# Patient Record
Sex: Male | Born: 1985 | Race: White | Hispanic: No | Smoking: Current every day smoker
Health system: Southern US, Community
[De-identification: ages and names within clinical notes are randomized; demographics above are authoritative.]

## PROBLEM LIST (undated history)

## (undated) DIAGNOSIS — G43909 Migraine, unspecified, not intractable, without status migrainosus: Secondary | ICD-10-CM

## (undated) DIAGNOSIS — F329 Major depressive disorder, single episode, unspecified: Secondary | ICD-10-CM

## (undated) DIAGNOSIS — R519 Headache, unspecified: Secondary | ICD-10-CM

## (undated) DIAGNOSIS — R51 Headache: Secondary | ICD-10-CM

## (undated) DIAGNOSIS — J302 Other seasonal allergic rhinitis: Secondary | ICD-10-CM

## (undated) DIAGNOSIS — F32A Depression, unspecified: Secondary | ICD-10-CM

## (undated) HISTORY — DX: Major depressive disorder, single episode, unspecified: F32.9

## (undated) HISTORY — DX: Other seasonal allergic rhinitis: J30.2

## (undated) HISTORY — PX: OTHER SURGICAL HISTORY: SHX169

## (undated) HISTORY — DX: Depression, unspecified: F32.A

## (undated) HISTORY — DX: Migraine, unspecified, not intractable, without status migrainosus: G43.909

## (undated) HISTORY — DX: Headache: R51

## (undated) HISTORY — DX: Headache, unspecified: R51.9

---

## 2005-11-11 ENCOUNTER — Emergency Department: Payer: Self-pay | Admitting: Emergency Medicine

## 2006-08-29 ENCOUNTER — Emergency Department: Payer: Self-pay | Admitting: Emergency Medicine

## 2007-01-14 ENCOUNTER — Emergency Department: Payer: Self-pay | Admitting: Emergency Medicine

## 2008-12-31 ENCOUNTER — Emergency Department: Payer: Self-pay | Admitting: Emergency Medicine

## 2011-07-22 ENCOUNTER — Other Ambulatory Visit: Payer: Self-pay

## 2011-07-22 LAB — CBC WITH DIFFERENTIAL/PLATELET
Basophil #: 0 10*3/uL (ref 0.0–0.1)
Basophil %: 0 %
Eosinophil #: 0.3 10*3/uL (ref 0.0–0.7)
Eosinophil %: 3.6 %
HCT: 40.9 % (ref 40.0–52.0)
HGB: 14.1 g/dL (ref 13.0–18.0)
Lymphocyte #: 2.6 10*3/uL (ref 1.0–3.6)
Lymphocyte %: 27.2 %
MCH: 32.2 pg (ref 26.0–34.0)
MCHC: 34.4 g/dL (ref 32.0–36.0)
MCV: 94 fL (ref 80–100)
Monocyte #: 0.5 x10 3/mm (ref 0.2–1.0)
Monocyte %: 5.3 %
Neutrophil #: 6.1 10*3/uL (ref 1.4–6.5)
Neutrophil %: 63.9 %
Platelet: 287 10*3/uL (ref 150–440)
RBC: 4.37 10*6/uL — ABNORMAL LOW (ref 4.40–5.90)
RDW: 13.3 % (ref 11.5–14.5)
WBC: 9.5 10*3/uL (ref 3.8–10.6)

## 2011-07-22 LAB — COMPREHENSIVE METABOLIC PANEL
Albumin: 4.8 g/dL (ref 3.4–5.0)
Alkaline Phosphatase: 60 U/L (ref 50–136)
Anion Gap: 5 — ABNORMAL LOW (ref 7–16)
BUN: 12 mg/dL (ref 7–18)
Bilirubin,Total: 0.6 mg/dL (ref 0.2–1.0)
Calcium, Total: 9.2 mg/dL (ref 8.5–10.1)
Chloride: 106 mmol/L (ref 98–107)
Co2: 28 mmol/L (ref 21–32)
Creatinine: 0.74 mg/dL (ref 0.60–1.30)
EGFR (African American): >60
EGFR (Non-African Amer.): >60
Glucose: 91 mg/dL (ref 65–99)
Osmolality: 277 (ref 275–301)
Potassium: 4 mmol/L (ref 3.5–5.1)
SGOT(AST): 24 U/L (ref 15–37)
SGPT (ALT): 17 U/L
Sodium: 139 mmol/L (ref 136–145)
Total Protein: 8.1 g/dL (ref 6.4–8.2)

## 2014-11-01 ENCOUNTER — Other Ambulatory Visit
Admission: RE | Admit: 2014-11-01 | Discharge: 2014-11-01 | Disposition: A | Discharge status: Home / Self Care | Payer: Self-pay | Source: Ambulatory Visit | Attending: Family Medicine | Admitting: Family Medicine

## 2014-11-12 ENCOUNTER — Telehealth: Payer: Self-pay

## 2014-11-12 ENCOUNTER — Encounter: Payer: Self-pay | Admitting: Family Medicine

## 2014-11-12 DIAGNOSIS — K253 Acute gastric ulcer without hemorrhage or perforation: Secondary | ICD-10-CM

## 2014-11-12 DIAGNOSIS — K269 Duodenal ulcer, unspecified as acute or chronic, without hemorrhage or perforation: Secondary | ICD-10-CM

## 2014-11-12 DIAGNOSIS — K449 Diaphragmatic hernia without obstruction or gangrene: Secondary | ICD-10-CM

## 2014-11-12 DIAGNOSIS — J4520 Mild intermittent asthma, uncomplicated: Secondary | ICD-10-CM

## 2014-11-12 DIAGNOSIS — D6489 Other specified anemias: Secondary | ICD-10-CM

## 2014-11-12 NOTE — Telephone Encounter (Signed)
Agree-jh 

## 2014-11-12 NOTE — Telephone Encounter (Signed)
Patient called complaining of discoloration of urine without pain at all. He states sometimes it is brown and dark other times it is red but never bright red. He has not had any abdominal pain or dysuria. Advised OV and advised him to drink fluids before OV and to go to ER if pain or fever before 8:15am.

## 2014-11-13 ENCOUNTER — Ambulatory Visit (INDEPENDENT_AMBULATORY_CARE_PROVIDER_SITE_OTHER): Payer: BLUE CROSS/BLUE SHIELD | Admitting: Family Medicine

## 2014-11-13 ENCOUNTER — Other Ambulatory Visit
Admission: RE | Admit: 2014-11-13 | Discharge: 2014-11-13 | Disposition: A | Discharge status: Home / Self Care | Payer: BLUE CROSS/BLUE SHIELD | Source: Ambulatory Visit | Attending: Family Medicine | Admitting: Family Medicine

## 2014-11-13 ENCOUNTER — Encounter: Payer: Self-pay | Admitting: Family Medicine

## 2014-11-13 VITALS — BP 127/79 | HR 96 | Temp 98.5°F | Resp 16 | Ht 66.75 in | Wt 122.4 lb

## 2014-11-13 DIAGNOSIS — R319 Hematuria, unspecified: Secondary | ICD-10-CM | POA: Diagnosis not present

## 2014-11-13 LAB — POCT URINALYSIS DIPSTICK
Bilirubin, UA: NEGATIVE
GLUCOSE UA: NEGATIVE
KETONES UA: NEGATIVE
Nitrite, UA: NEGATIVE
PH UA: 7
PROTEIN UA: 100
SPEC GRAV UA: 1.01
UROBILINOGEN UA: 0.2

## 2014-11-13 LAB — CK: Total CK: 67 U/L (ref 49–397)

## 2014-11-13 LAB — COMPREHENSIVE METABOLIC PANEL
ALBUMIN: 4.9 g/dL (ref 3.5–5.0)
ALT: 10 U/L — ABNORMAL LOW (ref 17–63)
AST: 22 U/L (ref 15–41)
Alkaline Phosphatase: 49 U/L (ref 38–126)
Anion gap: 8 (ref 5–15)
BUN: 11 mg/dL (ref 6–20)
CHLORIDE: 102 mmol/L (ref 101–111)
CO2: 28 mmol/L (ref 22–32)
Calcium: 9.4 mg/dL (ref 8.9–10.3)
Creatinine, Ser: 0.94 mg/dL (ref 0.61–1.24)
GFR calc Af Amer: >60 mL/min (ref >60)
GFR calc non Af Amer: >60 mL/min (ref >60)
GLUCOSE: 92 mg/dL (ref 65–99)
POTASSIUM: 4 mmol/L (ref 3.5–5.1)
Sodium: 138 mmol/L (ref 135–145)
Total Bilirubin: 0.8 mg/dL (ref 0.3–1.2)
Total Protein: 7.5 g/dL (ref 6.5–8.1)

## 2014-11-13 LAB — CBC WITH DIFFERENTIAL/PLATELET
Basophils Absolute: 0.1 10*3/uL (ref 0–0.1)
Basophils Relative: 1 %
EOS PCT: 1 %
Eosinophils Absolute: 0.1 10*3/uL (ref 0–0.7)
HCT: 41.4 % (ref 40.0–52.0)
Hemoglobin: 14.3 g/dL (ref 13.0–18.0)
LYMPHS ABS: 3.7 10*3/uL — AB (ref 1.0–3.6)
LYMPHS PCT: 30 %
MCH: 31.8 pg (ref 26.0–34.0)
MCHC: 34.5 g/dL (ref 32.0–36.0)
MCV: 92.2 fL (ref 80.0–100.0)
MONO ABS: 0.9 10*3/uL (ref 0.2–1.0)
Monocytes Relative: 7 %
Neutro Abs: 7.7 10*3/uL — ABNORMAL HIGH (ref 1.4–6.5)
Neutrophils Relative %: 61 %
PLATELETS: 281 10*3/uL (ref 150–440)
RBC: 4.49 MIL/uL (ref 4.40–5.90)
RDW: 12.4 % (ref 11.5–14.5)
WBC: 12.4 10*3/uL — ABNORMAL HIGH (ref 3.8–10.6)

## 2014-11-13 NOTE — Progress Notes (Signed)
Name: Thomas Burke   MRN: [id number]    DOB: 03-18-1985   Date:11/13/2014       Progress Note  Subjective  Chief Complaint  Chief Complaint  Patient presents with  . Hematuria    Pt noticed this on 11/09/14 after drinking [place] wine. Pt says he feels pressure in his left side for the past 2 days.    HPI  C/o gross blood in urine x 5 -6 days.  Started as brown, got better, then bright red.  Plus clots.  Never had anything like this before.  No dysuria.  No fever.  No N/V/D.   Feels ome pressure in LUQ of abd x about 2 days.  Np pain in groin or testicles.  No other illness preceedoing, esp. ST.  No weight loss. No problem-specific assessment & plan notes found for this encounter.   Past Medical History  Diagnosis Date  . Seasonal allergies   . Depression   . Persistent headaches   . Migraine     Social History  Substance Use Topics  . Smoking status: Current Every Day Smoker -- 1.50 packs/day for 12 years    Types: Cigarettes  . Smokeless tobacco: Never Used  . Alcohol Use: 0.0 oz/week    0 Standard drinks or equivalent per week     Current outpatient prescriptions:  .  aspirin-acetaminophen-caffeine (EXCEDRIN MIGRAINE) 250-250-65 MG per tablet, Take 1 tablet by mouth every 6 (six) hours as needed for headache., Disp: , Rfl:  .  Aspirin-Salicylamide-Caffeine (BC HEADACHE POWDER PO), Take 1 each by mouth daily., Disp: , Rfl:   No Known Allergies  Review of Systems  Constitutional: Negative for fever, chills, weight loss and malaise/fatigue.  HENT: Negative for hearing loss.   Eyes: Negative for blurred vision and double vision.  Respiratory: Negative for cough, sputum production, shortness of breath and wheezing.   Cardiovascular: Negative for chest pain, palpitations, orthopnea and leg swelling.  Gastrointestinal: Positive for abdominal pain (vague LUQ pain). Negative for heartburn, nausea, vomiting, diarrhea and blood in stool.  Genitourinary: Positive  for frequency and hematuria. Negative for dysuria and urgency.  Skin: Negative for rash.  Neurological: Positive for headaches. Negative for dizziness, sensory change, focal weakness and weakness.      Objective  Filed Vitals:   11/13/14 0822  BP: 127/79  Pulse: 96  Temp: 98.5 F (36.9 C)  TempSrc: Oral  Resp: 16  Height: 5' 6.75" (1.695 m)  Weight: 122 lb 6.4 oz (55.52 kg)     Physical Exam  Constitutional: He is oriented to person, place, and time and well-developed, well-nourished, and in no distress. No distress.  HENT:  Head: Normocephalic and atraumatic.  Eyes: Conjunctivae and EOM are normal. Pupils are equal, round, and reactive to light. No scleral icterus.  Neck: Normal range of motion. Neck supple. Carotid bruit is not present. No thyromegaly present.  Cardiovascular: Normal rate, regular rhythm, normal heart sounds and intact distal pulses.  Exam reveals no gallop and no friction rub.   No murmur heard. Pulmonary/Chest: Effort normal and breath sounds normal. No respiratory distress. He has no wheezes. He has no rales.  Abdominal: Soft. Bowel sounds are normal. He exhibits no distension and no mass. There is tenderness (Vague tenderness in LUQ.). There is no rebound and no guarding.  Musculoskeletal: He exhibits no edema.  Lymphadenopathy:    He has no cervical adenopathy.  Neurological: He is alert and oriented to person, place, and time.  Vitals  reviewed.     Recent Results (from the past 2160 hour(s))  POCT urinalysis dipstick     Status: Abnormal   Collection Time: 11/13/14  8:33 AM  Result Value Ref Range   Color, UA bloody    Clarity, UA bloody    Glucose, UA neg    Bilirubin, UA neg    Ketones, UA neg    Spec Grav, UA 1.010    Blood, UA large    pH, UA 7.0    Protein, UA 100    Urobilinogen, UA 0.2    Nitrite, UA neg    Leukocytes, UA Trace (A) Negative     Assessment & Plan  1. Hematuria  - POCT urinalysis dipstick - CULTURE, URINE  COMPREHENSIVE - Comprehensive Metabolic Panel (CMET) - CBC with Differential - CK (Creatine Kinase) - Antistreptolysin O titer - US Renal; Future

## 2014-11-13 NOTE — Patient Instructions (Signed)
Discontinue all Aspirin, caffeine and alcohol for now.  Drink plenty of water.  Avoid excessive exercise.

## 2014-11-14 ENCOUNTER — Other Ambulatory Visit: Payer: Self-pay | Admitting: Family Medicine

## 2014-11-14 ENCOUNTER — Ambulatory Visit
Admission: RE | Admit: 2014-11-14 | Discharge: 2014-11-14 | Disposition: A | Discharge status: Home / Self Care | Payer: BLUE CROSS/BLUE SHIELD | Source: Ambulatory Visit | Attending: Family Medicine | Admitting: Family Medicine

## 2014-11-14 DIAGNOSIS — R310 Gross hematuria: Secondary | ICD-10-CM | POA: Insufficient documentation

## 2014-11-14 DIAGNOSIS — R319 Hematuria, unspecified: Secondary | ICD-10-CM

## 2014-11-14 LAB — ANTISTREPTOLYSIN O TITER: ASO: 59 IU/mL (ref 0.0–200.0)

## 2014-11-14 NOTE — Progress Notes (Signed)
Advised and he is awaiting call about Urine.[facility]

## 2014-11-15 LAB — CULTURE, URINE COMPREHENSIVE

## 2014-11-17 ENCOUNTER — Telehealth: Payer: Self-pay | Admitting: Family Medicine

## 2014-11-17 ENCOUNTER — Other Ambulatory Visit: Payer: Self-pay | Admitting: Family Medicine

## 2014-11-17 DIAGNOSIS — N2889 Other specified disorders of kidney and ureter: Secondary | ICD-10-CM

## 2014-11-17 NOTE — Telephone Encounter (Signed)
Pt.  Return your call. Pt call back # is  [phone]

## 2014-11-17 NOTE — Telephone Encounter (Signed)
Advised pt regarding test result.

## 2014-11-21 ENCOUNTER — Ambulatory Visit: Payer: BLUE CROSS/BLUE SHIELD | Admitting: Family Medicine

## 2014-11-21 ENCOUNTER — Telehealth: Payer: Self-pay | Admitting: *Deleted

## 2014-11-21 NOTE — Telephone Encounter (Signed)
If patient is not having any new problems, appt for today can be rescheduled until after MRI on 11/24/14.

## 2014-11-24 ENCOUNTER — Other Ambulatory Visit: Payer: Self-pay | Admitting: Family Medicine

## 2014-11-24 ENCOUNTER — Ambulatory Visit
Admission: RE | Admit: 2014-11-24 | Discharge: 2014-11-24 | Disposition: A | Discharge status: Home / Self Care | Payer: BLUE CROSS/BLUE SHIELD | Source: Ambulatory Visit | Attending: Family Medicine | Admitting: Family Medicine

## 2014-11-24 DIAGNOSIS — N2889 Other specified disorders of kidney and ureter: Secondary | ICD-10-CM

## 2014-11-25 ENCOUNTER — Other Ambulatory Visit: Payer: Self-pay | Admitting: Family Medicine

## 2014-11-25 DIAGNOSIS — R310 Gross hematuria: Secondary | ICD-10-CM

## 2014-11-28 ENCOUNTER — Ambulatory Visit (INDEPENDENT_AMBULATORY_CARE_PROVIDER_SITE_OTHER): Payer: BLUE CROSS/BLUE SHIELD | Admitting: Family Medicine

## 2014-11-28 ENCOUNTER — Encounter: Payer: Self-pay | Admitting: Family Medicine

## 2014-11-28 VITALS — BP 118/78 | HR 64 | Temp 98.0°F | Resp 16 | Ht 66.5 in | Wt 123.8 lb

## 2014-11-28 DIAGNOSIS — R319 Hematuria, unspecified: Secondary | ICD-10-CM | POA: Diagnosis not present

## 2014-11-28 NOTE — Patient Instructions (Signed)
  Cont. To avoid Aspirin, Ibuprofen, Aleve and alcohol for now.

## 2014-11-28 NOTE — Progress Notes (Signed)
Name: Thomas Burke   MRN: [id number]    DOB: 02-28-1986   Date:11/28/2014       Progress Note  Subjective  Chief Complaint  Chief Complaint  Patient presents with  . Hematuria    pt thinks it's cheap liquor here for follow up after MRI    HPI  Here for f/u of gross hematuria.  Hematuria has now resolved.  W/u so far has been neg, including renal US and MRI.  He has apt. With [employer]. Next week. No problem-specific assessment & plan notes found for this encounter.   Past Medical History  Diagnosis Date  . Seasonal allergies   . Depression   . Persistent headaches   . Migraine     Social History  Substance Use Topics  . Smoking status: Current Every Day Smoker -- 1.50 packs/day for 12 years    Types: Cigarettes  . Smokeless tobacco: Never Used  . Alcohol Use: 0.0 oz/week    0 Standard drinks or equivalent per week     Current outpatient prescriptions:  .  aspirin-acetaminophen-caffeine (EXCEDRIN MIGRAINE) 559-741-63 MG per tablet, Take 1 tablet by mouth every 6 (six) hours as needed for headache., Disp: , Rfl:  .  Aspirin-Salicylamide-Caffeine (BC HEADACHE POWDER PO), Take 1 each by mouth daily., Disp: , Rfl:   No Known Allergies  Review of Systems  Constitutional: Negative for fever and chills.  Respiratory: Negative for cough, sputum production, shortness of breath and wheezing.   Cardiovascular: Negative for chest pain, palpitations, orthopnea and leg swelling.  Gastrointestinal: Negative for heartburn, abdominal pain and blood in stool.  Genitourinary: Negative for dysuria, urgency, frequency and hematuria.  Skin: Negative for rash.      Objective  Filed Vitals:   11/28/14 0809  BP: 118/78  Pulse: 64  Temp: 98 F (36.7 C)  TempSrc: Oral  Resp: 16  Height: 5' 6.5" (1.689 m)  Weight: 123 lb 12.8 oz (56.155 kg)     Physical Exam  Constitutional: He is oriented to person, place, and time and well-developed, well-nourished, and in no  distress. No distress.  Cardiovascular: Normal rate, regular rhythm, normal heart sounds and intact distal pulses.   Pulmonary/Chest: Effort normal and breath sounds normal.  Abdominal: Soft. Bowel sounds are normal. He exhibits no distension and no mass. There is no tenderness.  No CVA tenderness  Musculoskeletal: He exhibits no edema.  Neurological: He is alert and oriented to person, place, and time.  Vitals reviewed.     Recent Results (from the past 2160 hour(s))  CULTURE, URINE COMPREHENSIVE     Status: None   Collection Time: 11/13/14 12:00 AM  Result Value Ref Range   Urine Culture, Comprehensive Final report    Result 1 Comment     Comment: Mixed urogenital flora 10,000-25,000 colony forming units per mL   POCT urinalysis dipstick     Status: Abnormal   Collection Time: 11/13/14  8:33 AM  Result Value Ref Range   Color, UA bloody    Clarity, UA bloody    Glucose, UA neg    Bilirubin, UA neg    Ketones, UA neg    Spec Grav, UA 1.010    Blood, UA large    pH, UA 7.0    Protein, UA 100    Urobilinogen, UA 0.2    Nitrite, UA neg    Leukocytes, UA Trace (A) Negative  Comprehensive metabolic panel     Status: Abnormal   Collection Time: 11/13/14  10:35 AM  Result Value Ref Range   Sodium 138 135 - 145 mmol/L   Potassium 4.0 3.5 - 5.1 mmol/L   Chloride 102 101 - 111 mmol/L   CO2 28 22 - 32 mmol/L   Glucose, Bld 92 65 - 99 mg/dL   BUN 11 6 - 20 mg/dL   Creatinine, Ser 0.94 0.61 - 1.24 mg/dL   Calcium 9.4 8.9 - 10.3 mg/dL   Total Protein 7.5 6.5 - 8.1 g/dL   Albumin 4.9 3.5 - 5.0 g/dL   AST 22 15 - 41 U/L   ALT 10 (L) 17 - 63 U/L   Alkaline Phosphatase 49 38 - 126 U/L   Total Bilirubin 0.8 0.3 - 1.2 mg/dL   GFR calc non Af Amer >60 >60 mL/min   GFR calc Af Amer >60 >60 mL/min    Comment: (NOTE) The eGFR has been calculated using the CKD EPI equation. This calculation has not been validated in all clinical situations. eGFR's persistently <60 mL/min signify  possible Chronic Kidney Disease.    Anion gap 8 5 - 15  CBC with Differential/Platelet     Status: Abnormal   Collection Time: 11/13/14 10:35 AM  Result Value Ref Range   WBC 12.4 (H) 3.8 - 10.6 K/uL   RBC 4.49 4.40 - 5.90 MIL/uL   Hemoglobin 14.3 13.0 - 18.0 g/dL   HCT 41.4 40.0 - 52.0 %   MCV 92.2 80.0 - 100.0 fL   MCH 31.8 26.0 - 34.0 pg   MCHC 34.5 32.0 - 36.0 g/dL   RDW 12.4 11.5 - 14.5 %   Platelets 281 150 - 440 K/uL   Neutrophils Relative % 61 %   Neutro Abs 7.7 (H) 1.4 - 6.5 K/uL   Lymphocytes Relative 30 %   Lymphs Abs 3.7 (H) 1.0 - 3.6 K/uL   Monocytes Relative 7 %   Monocytes Absolute 0.9 0.2 - 1.0 K/uL   Eosinophils Relative 1 %   Eosinophils Absolute 0.1 0 - 0.7 K/uL   Basophils Relative 1 %   Basophils Absolute 0.1 0 - 0.1 K/uL  CK     Status: None   Collection Time: 11/13/14 10:35 AM  Result Value Ref Range   Total CK 67 49 - 397 U/L  Antistreptolysin O titer     Status: None   Collection Time: 11/13/14 10:35 AM  Result Value Ref Range   ASO 59.0 0.0 - 200.0 IU/mL    Comment: (NOTE) Performed At: [facility] [address] [place], [place] [id number] Lindon Romp MD OZ:[id number]      Assessment & Plan  1. Hematuria -Keep appt. at [facility] next week as scheduled.

## 2014-12-03 ENCOUNTER — Ambulatory Visit: Payer: BLUE CROSS/BLUE SHIELD | Admitting: Obstetrics and Gynecology

## 2014-12-05 ENCOUNTER — Ambulatory Visit: Payer: BLUE CROSS/BLUE SHIELD | Admitting: Obstetrics and Gynecology

## 2014-12-15 ENCOUNTER — Encounter: Payer: Self-pay | Admitting: Obstetrics and Gynecology

## 2014-12-15 ENCOUNTER — Ambulatory Visit (INDEPENDENT_AMBULATORY_CARE_PROVIDER_SITE_OTHER): Payer: BLUE CROSS/BLUE SHIELD | Admitting: Obstetrics and Gynecology

## 2014-12-15 VITALS — BP 129/79 | HR 83 | Resp 16 | Ht 68.0 in | Wt 125.8 lb

## 2014-12-15 DIAGNOSIS — R310 Gross hematuria: Secondary | ICD-10-CM | POA: Diagnosis not present

## 2014-12-15 LAB — URINALYSIS, COMPLETE
Bilirubin, UA: NEGATIVE
GLUCOSE, UA: NEGATIVE
Ketones, UA: NEGATIVE
Leukocytes, UA: NEGATIVE
NITRITE UA: NEGATIVE
Protein, UA: NEGATIVE
RBC, UA: NEGATIVE
Specific Gravity, UA: 1.01 (ref 1.005–1.030)
UUROB: 0.2 mg/dL (ref 0.2–1.0)
pH, UA: 6.5 (ref 5.0–7.5)

## 2014-12-15 LAB — MICROSCOPIC EXAMINATION
EPITHELIAL CELLS (NON RENAL): NONE SEEN /HPF (ref 0–10)
RBC MICROSCOPIC, UA: NONE SEEN /HPF (ref 0–?)
Renal Epithel, UA: NONE SEEN /hpf
WBC UA: NONE SEEN /HPF (ref 0–?)
YEAST UA: NONE SEEN

## 2014-12-15 NOTE — Progress Notes (Signed)
12/15/2014 8:08 PM   Thomas Burke 11-30-1985 [id number]  Referring provider: Janeann Forehand., MD [address] [place], [place] [address]  Chief Complaint  Patient presents with  . Hematuria  . Establish Care    HPI: Patient is a 29 year old male presenting today as a referral from his primary care provider with complaints of gross hematuria. He reports that symptoms occurred approximate 1 month ago and lasted for 1-2 weeks. He noted dark red blood in his urine with some possible small blood clots.  Renal ultrasound was ordered by his primary care provider remarkable for a questionable isoechoic mass in the upper pole of his right kidney versus prominent renal pyramid. An abdominal MRI was then ordered which patient refused IV contrast to be administered. No renal mass was identified on MRI. There was a stable 5 mm right mid anterior cortical cyst.   Patient states that he he believes that gross hematuria was associated with him drinking large amounts of liquor the night before it occurred.  Current smoker 1ppd x 60yrs..    No history of renal stones. No family history of GU cancers.  Father has kidney stones.  Drinks 2L soda a day. Very little water. Works 3rd shift.   PMH: Past Medical History  Diagnosis Date  . Seasonal allergies   . Depression   . Persistent headaches   . Migraine     Surgical History: Past Surgical History  Procedure Laterality Date  . None      Home Medications:    Medication List       This list is accurate as of: 12/15/14  8:08 PM.  Always use your most recent med list.               aspirin-acetaminophen-caffeine 250-250-65 MG tablet  Commonly known as:  EXCEDRIN MIGRAINE  Take 1 tablet by mouth every 6 (six) hours as needed for headache.     BC HEADACHE POWDER PO  Take 1 each by mouth daily.        Allergies: No Known Allergies  Family History: Family History  Problem Relation Age of Onset  . Depression Mother      Social History:  reports that he has been smoking Cigarettes.  He has a 18 pack-year smoking history. He has never used smokeless tobacco. He reports that he drinks alcohol. He reports that he does not use illicit drugs.  ROS: UROLOGY Frequent Urination?: No Hard to postpone urination?: No Burning/pain with urination?: No Get up at night to urinate?: No Leakage of urine?: No Urine stream starts and stops?: No Trouble starting stream?: No Do you have to strain to urinate?: No Blood in urine?: Yes Urinary tract infection?: No Sexually transmitted disease?: No Injury to kidneys or bladder?: No Painful intercourse?: No Weak stream?: No Erection problems?: No Penile pain?: No  Gastrointestinal Nausea?: No Vomiting?: No Indigestion/heartburn?: No Diarrhea?: No Constipation?: No  Constitutional Fever: No Night sweats?: No Weight loss?: No Fatigue?: No  Skin Skin rash/lesions?: No Itching?: No  Eyes Blurred vision?: No Double vision?: No  Ears/Nose/Throat Sore throat?: No Sinus problems?: No  Hematologic/Lymphatic Swollen glands?: No Easy bruising?: No  Cardiovascular Leg swelling?: No Chest pain?: No  Respiratory Cough?: No Shortness of breath?: Yes  Endocrine Excessive thirst?: Yes  Musculoskeletal Back pain?: No Joint pain?: No  Neurological Headaches?: Yes Dizziness?: No  Psychologic Depression?: No Anxiety?: No  Physical Exam: BP 129/79 mmHg  Pulse 83  Resp 16  Ht   (1.727 m)  Wt 125 lb 12.8 oz (57.063 kg)  BMI 19.13 kg/m2  Constitutional:  Alert and oriented, No acute distress. HEENT: [place] AT, moist mucus membranes.  Trachea midline, no masses. Cardiovascular: No clubbing, cyanosis, or edema. Respiratory: Normal respiratory effort, no increased work of breathing. GI: Abdomen is soft, nontender, nondistended, no abdominal masses GU: No CVA tenderness.  Normal circumcised phallus, testicles distended bilaterally without masses  or tenderness, normal meatus Skin: No rashes, bruises or suspicious lesions. Lymph: No cervical or inguinal adenopathy. Neurologic: Grossly intact, no focal deficits, moving all 4 extremities. Psychiatric: Normal mood and affect.  Laboratory Data: Lab Results  Component Value Date   WBC 12.4* 11/13/2014   HGB 14.3 11/13/2014   HCT 41.4 11/13/2014   MCV 92.2 11/13/2014   PLT 281 11/13/2014    Lab Results  Component Value Date   CREATININE 0.94 11/13/2014    No results found for: PSA  No results found for: TESTOSTERONE  No results found for: HGBA1C  Urinalysis    Component Value Date/Time   GLUCOSEU Negative 12/15/2014 0949   BILIRUBINUR Negative 12/15/2014 0949   BILIRUBINUR neg 11/13/2014 0833   PROTEINUR 100 11/13/2014 0833   UROBILINOGEN 0.2 11/13/2014 0833   NITRITE Negative 12/15/2014 0949   NITRITE neg 11/13/2014 0833   LEUKOCYTESUR Negative 12/15/2014 0949   LEUKOCYTESUR Trace* 11/13/2014 0833    Pertinent Imaging: CLINICAL DATA: Right renal mass on prior exam. Hematuria for 2 weeks. Patient refused contrast.  EXAM: MRI ABDOMEN WITHOUT CONTRAST FINDINGS: Lower chest: Allowing for motion artifact, lung bases are clear.  Hepatobiliary: Motion artifact obscures detail. Grossly normal appearance of the liver and gallbladder.  Pancreas: Not well visualized, unremarkable in visualized aspects.  Spleen: Grossly normal  Adrenals/Urinary Tract: 5 mm anterior right mid renal cortical T2 hyperintense presumed cyst again noted. This is stable. No hydronephrosis. Adrenal glands are poorly visualized but grossly unremarkable where seen.  Stomach/Bowel: Grossly normal  Vascular/Lymphatic: No aortic aneurysm. No lymphadenopathy.  Other: No ascites. Trace if any peritoneal fluid is most likely an incidental finding.  Musculoskeletal: Mild leftward curvature of the thoracolumbar spine centered at L2. No acute osseous  abnormality.  IMPRESSION: No renal mass identified to correlate with the questioned finding on the prior exam. Stable 5 mm right mid anterior cortical cyst reidentified.  Extensive motion degradation and suboptimal visualization without contrast. Electronically Signed  By: Christiana Pellant M.D.  On: 11/24/2014 16:29 CLINICAL DATA: Gross hematuria for 1 week  EXAM: RENAL / URINARY TRACT ULTRASOUND COMPLETE COMPARISON: January 01, 2009 FINDINGS: Right Kidney: Length: 11.3 cm. Echogenicity and renal cortical thickness are within normal limits. No perinephric fluid or hydronephrosis visualized. There is a slightly ovoid area of soft tissue prominence in the upper pole region measuring 1.7 x 1.2 x 1.1 cm which is essentially isoechoic with remainder of the renal parenchyma. It is frankly difficult to ascertain whether this area represents a prominent renal pyramid versus a small isoechoic mass. There is no other evidence suggesting potential mass on the right. There is no sonographically demonstrable calculus or ureterectasis. Left Kidney: Length: 10.8 cm. Echogenicity and renal echogenicity are within normal limits. No mass, perinephric fluid, or hydronephrosis visualized. No sonographically demonstrable calculus or ureterectasis. Bladder: Appears normal for degree of bladder distention. IMPRESSION: Question isoechoic mass upper pole right kidney versus prominent renal pyramid. Given this finding and the history of hematuria without other potential explanation for hematuria, pre and post-contrast MR CT to further evaluate this area is  felt to be warranted. Study otherwise unremarkable. On: 11/14/2014 15:49  Assessment & Plan:    1. Gross Hematuria- We discussed the differential diagnosis for hematuria including nephrolithiasis, renal or upper tract tumors, bladder stones, UTIs, or bladder tumors as well as undetermined etiologies. Per AUA guidelines, I did recommend  complete microscopic hematuria evaluation including CTU, possible urine cytology, and office cystoscopy. Patient states understanding and is willing to proceed with plan. - Urinalysis, Complete  Return for CT Urogram results; schedule cystoscopy.  Earlie Lou, FNP  [facility] [address] [place], [place] [address] [phone]

## 2014-12-25 ENCOUNTER — Ambulatory Visit
Admission: RE | Admit: 2014-12-25 | Discharge: 2014-12-25 | Disposition: A | Discharge status: Home / Self Care | Payer: BLUE CROSS/BLUE SHIELD | Source: Ambulatory Visit | Attending: Obstetrics and Gynecology | Admitting: Obstetrics and Gynecology

## 2014-12-25 DIAGNOSIS — R310 Gross hematuria: Secondary | ICD-10-CM | POA: Diagnosis present

## 2014-12-25 MED ORDER — IOHEXOL 350 MG/ML SOLN
150.0000 mL | Freq: Once | INTRAVENOUS | Status: AC | PRN
  Administered 2014-12-25: 150 mL via INTRAVENOUS

## 2015-01-02 ENCOUNTER — Ambulatory Visit (INDEPENDENT_AMBULATORY_CARE_PROVIDER_SITE_OTHER): Payer: BLUE CROSS/BLUE SHIELD | Admitting: Urology

## 2015-01-02 VITALS — BP 110/68 | HR 100 | Ht 68.0 in | Wt 123.0 lb

## 2015-01-02 DIAGNOSIS — R319 Hematuria, unspecified: Secondary | ICD-10-CM | POA: Diagnosis not present

## 2015-01-02 LAB — URINALYSIS, COMPLETE
BILIRUBIN UA: NEGATIVE
Glucose, UA: NEGATIVE
KETONES UA: NEGATIVE
LEUKOCYTES UA: NEGATIVE
Nitrite, UA: NEGATIVE
PROTEIN UA: NEGATIVE
RBC UA: NEGATIVE
Specific Gravity, UA: 1.02 (ref 1.005–1.030)
Urobilinogen, Ur: 0.2 mg/dL (ref 0.2–1.0)
pH, UA: 7 (ref 5.0–7.5)

## 2015-01-02 LAB — MICROSCOPIC EXAMINATION
Bacteria, UA: NONE SEEN
EPITHELIAL CELLS (NON RENAL): NONE SEEN /HPF (ref 0–10)
RBC, UA: NONE SEEN /hpf (ref 0–?)
Renal Epithel, UA: NONE SEEN /hpf
WBC, UA: NONE SEEN /hpf (ref 0–?)

## 2015-01-02 MED ORDER — CIPROFLOXACIN HCL 500 MG PO TABS: 500.0000 mg | ORAL_TABLET | Freq: Once | ORAL | Status: DC

## 2015-01-02 MED ORDER — LIDOCAINE HCL 2 % EX GEL: 1.0000 "application " | Freq: Once | CUTANEOUS | Status: DC

## 2015-01-02 NOTE — Progress Notes (Signed)
01/02/2015 1:56 PM   Thomas Burke Aug 02, 1985 [id number]   Frequency is stable Referring provider: Janeann ForehandJames H Hawkins Jr., MD [address] [place], [place] [id number]  Chief Complaint  Patient presents with  . Hematuria    HPI: Patient is a 29 year old male presenting today as a referral from his primary care provider with complaints of gross hematuria. He reports that symptoms occurred approximate 1 month ago and lasted for 1-2 weeks. He noted dark red blood in his urine with some possible small blood clots. Renal ultrasound was ordered by his primary care provider remarkable for a questionable isoechoic mass in the upper pole of his right kidney versus prominent renal pyramid. An abdominal MRI was then ordered which patient refused IV contrast to be administered. No renal mass was identified on MRI. There was a stable 5 mm right mid anterior cortical cyst.  Patient states that he he believes that gross hematuria was associated with him drinking large amounts of liquor the night before it occurred.  He is had a follow-up normal MRI but he refused the contrast. He went on to have a CT scan and had no renal abnormalities identified  Frequency is stable.  He is no blood in the urine today    PMH: Past Medical History  Diagnosis Date  . Seasonal allergies   . Depression   . Persistent headaches   . Migraine     Surgical History: Past Surgical History  Procedure Laterality Date  . None      Home Medications:    Medication List       This list is accurate as of: 01/02/15  1:56 PM.  Always use your most recent med list.               aspirin-acetaminophen-caffeine 250-250-65 MG tablet  Commonly known as:  EXCEDRIN MIGRAINE  Take 1 tablet by mouth every 6 (six) hours as needed for headache.     BC HEADACHE POWDER PO  Take 1 each by mouth daily.        Allergies: No Known Allergies  Family History: Family History  Problem Relation Age of Onset  .  Depression Mother     Social History:  reports that he has been smoking Cigarettes.  He has a 18 pack-year smoking history. He has never used smokeless tobacco. He reports that he drinks alcohol. He reports that he does not use illicit drugs.  ROS:                                        Physical Exam: BP 110/68 mmHg  Pulse 100  Ht 5\' 8"  (1.727 m)  Wt 123 lb (55.792 kg)  BMI 18.71 kg/m2  Constitutional:  Alert and oriented, No acute distress. HEENT: [place] AT, moist mucus membranes.  Trachea midline, no masses. Cardiovascular: No clubbing, cyanosis, or edema. Respiratory: Normal respiratory effort, no increased work of breathing. GI: Abdomen is soft, nontender, nondistended, no abdominal masses GU: No CVA tenderness.  Skin: No rashes, bruises or suspicious lesions. Lymph: No cervical or inguinal adenopathy. Neurologic: Grossly intact, no focal deficits, moving all 4 extremities. Psychiatric: Normal mood and affect.  Laboratory Data: Lab Results  Component Value Date   WBC 12.4* 11/13/2014   HGB 14.3 11/13/2014   HCT 41.4 11/13/2014   MCV 92.2 11/13/2014   PLT 281 11/13/2014    Lab Results  Component Value Date   CREATININE 0.94 11/13/2014    No results found for: PSA  No results found for: TESTOSTERONE  No results found for: HGBA1C  Urinalysis    Component Value Date/Time   GLUCOSEU Negative 12/15/2014 0949   BILIRUBINUR Negative 12/15/2014 0949   BILIRUBINUR neg 11/13/2014 0833   PROTEINUR 100 11/13/2014 0833   UROBILINOGEN 0.2 11/13/2014 0833   NITRITE Negative 12/15/2014 0949   NITRITE neg 11/13/2014 0833   LEUKOCYTESUR Negative 12/15/2014 0949   LEUKOCYTESUR Trace* 11/13/2014 0833    Pertinent Imaging: Reviewed x-rays  Assessment & Plan:  I walked Mr. Zolman through the differential diagnosis and the workup for blood in the urine. He had dark color urine after drinking a lot of alcohol the night before. His urine has been  visibly clear and microscopically clear. He does have a smoking history and his risk factors were discussed  He told myself and the nurses that he did not want cystoscopy and I honored his choice. Again differential was discussed though uncommon in his age group  1. Hematuria He did agree to come back in 2 months since he does not have a common primary care physician and have a repeat urine for blood in the urine   - ciprofloxacin (CIPRO) tablet 500 mg; Take 1 tablet (500 mg total) by mouth once. - lidocaine (XYLOCAINE) 2 % jelly 1 application; Place 1 application into the urethra once. - Urinalysis, Complete   No Follow-up on file.  Martina Sinner, MD  [facility] [address], [address] [place], [place] [address] [phone]

## 2015-02-11 ENCOUNTER — Encounter: Payer: Self-pay | Admitting: Urology

## 2015-02-11 ENCOUNTER — Ambulatory Visit: Payer: BLUE CROSS/BLUE SHIELD | Admitting: Urology

## 2015-04-30 ENCOUNTER — Ambulatory Visit (INDEPENDENT_AMBULATORY_CARE_PROVIDER_SITE_OTHER): Payer: BLUE CROSS/BLUE SHIELD | Admitting: Family Medicine

## 2015-04-30 ENCOUNTER — Encounter: Payer: Self-pay | Admitting: Family Medicine

## 2015-04-30 VITALS — BP 108/64 | HR 78 | Temp 98.2°F | Resp 16 | Ht 68.0 in | Wt 121.0 lb

## 2015-04-30 DIAGNOSIS — R369 Urethral discharge, unspecified: Secondary | ICD-10-CM

## 2015-04-30 DIAGNOSIS — R300 Dysuria: Secondary | ICD-10-CM

## 2015-04-30 LAB — POCT URINALYSIS DIPSTICK
Bilirubin, UA: NEGATIVE
Blood, UA: NEGATIVE
Glucose, UA: NEGATIVE
KETONES UA: NEGATIVE
LEUKOCYTES UA: NEGATIVE
Nitrite, UA: NEGATIVE
PH UA: 6.5
Protein, UA: POSITIVE
SPEC GRAV UA: 1.025
Urobilinogen, UA: 0.2

## 2015-04-30 MED ORDER — DOXYCYCLINE HYCLATE 100 MG PO TABS: 100.0000 mg | ORAL_TABLET | Freq: Two times a day (BID) | ORAL | Status: DC

## 2015-04-30 NOTE — Patient Instructions (Signed)
You are being treated for a urinary tract infection today. Please take your antibiotic as directed. If you develop severe flank pain, blood in the urine, fever, nausea or vomiting, please seek immediate medical attention in the ER.   If you penile discharge gets worse, please come back to be seen in office.

## 2015-04-30 NOTE — Progress Notes (Signed)
Subjective:    Patient ID: Thomas Burke, male    DOB: Oct 03, 1985, 30 y.o.   MRN: [id number]  HPI: Thomas Burke is a 29 y.o. male presenting on 04/30/2015 for Dysuria   HPI    Pt presents for possible UTI. Pain with urination when bladder is full and in the morning x2 days. Had had urethral discharge that is normally clear, but once thick and white once, first noticed on Tuesday. Recently had a stomach bug with lots of diarrhea Saturday-Sunday and thinks he may have a UTI related to this. He is sexually active. Married and monogamous- last sexual enounter 3 weeks ago. Had gonorrhea once before- treated a few years ago. Symptoms do not feel the same.  No fever/chills. No hesitancy or urgency or frequency.  Past Medical History  Diagnosis Date  . Seasonal allergies   . Depression   . Persistent headaches   . Migraine     No current outpatient prescriptions on file prior to visit.   No current facility-administered medications on file prior to visit.    Review of Systems  Constitutional: Negative for fever, chills, activity change, appetite change and fatigue.  HENT: Negative for congestion, ear pain, rhinorrhea and trouble swallowing.   Eyes: Negative for pain, discharge and visual disturbance.  Respiratory: Negative for cough, chest tightness and shortness of breath.   Cardiovascular: Negative for chest pain.  Gastrointestinal: Positive for abdominal pain. Negative for nausea, vomiting and diarrhea.  Endocrine: Positive for polydipsia.  Genitourinary: Positive for dysuria and discharge. Negative for urgency, frequency, hematuria, flank pain, decreased urine volume, difficulty urinating and penile pain.  Musculoskeletal: Negative for myalgias and back pain.  Skin: Negative for color change.  Neurological: Negative for dizziness, weakness and light-headedness.  Hematological: Negative for adenopathy.  Psychiatric/Behavioral: Negative for behavioral problems and  agitation.   Per HPI unless specifically indicated above     Objective:    BP 108/64 mmHg  Pulse 78  Temp(Src) 98.2 F (36.8 C)  Resp 16  Ht  (1.727 m)  Wt 121 lb (54.885 kg)  BMI 18.40 kg/m2  Wt Readings from Last 3 Encounters:  04/30/15 121 lb (54.885 kg)  01/02/15 123 lb (55.792 kg)  12/15/14 125 lb 12.8 oz (57.063 kg)    Physical Exam  Constitutional: He is oriented to person, place, and time. He appears well-developed and well-nourished.  HENT:  Head: Normocephalic.  Neck: Normal range of motion. Neck supple.  Cardiovascular: Normal rate, regular rhythm and normal heart sounds.   Pulmonary/Chest: Effort normal and breath sounds normal.  Abdominal: Soft. Bowel sounds are normal. He exhibits no distension. There is no tenderness. There is no guarding and no CVA tenderness.  Genitourinary:  Pt refused GU exam including penis and prostate. Risks of lack of exam reviewed with patient.   Musculoskeletal: Normal range of motion.  Neurological: He is alert and oriented to person, place, and time.  Skin: Skin is warm and dry.  Psychiatric: He has a normal mood and affect. His behavior is normal.   Results for orders placed or performed in visit on 04/30/15  POCT urinalysis dipstick  Result Value Ref Range   Color, UA straw    Clarity, UA cloudy    Glucose, UA neg    Bilirubin, UA neg    Ketones, UA neg    Spec Grav, UA 1.025    Blood, UA neg    pH, UA 6.5    Protein, UA pos  Urobilinogen, UA 0.2    Nitrite, UA neg    Leukocytes, UA Negative Negative      Assessment & Plan:   Problem List Items Addressed This Visit    None    Visit Diagnoses    Dysuria    -  Primary    UA is clear, UC to r/o infection. Assume pain is STI or dehydration releated. Encouraged plenty of fluids. Alarm symptoms reviewed.     Relevant Medications    doxycycline (VIBRA-TABS) 100 MG tablet    Other Relevant Orders    POCT urinalysis dipstick (Completed)    CULTURE, URINE  COMPREHENSIVE    GC/Chlamydia Probe Amp    Penile discharge        GC/chlamydia testing to r/o STI. Prostate exam declined- unsure if prostatitis. Treat empirically with Doxy BID x 10 day. Plan for return for IM ceftriaxone if cultures are positive. Alarm symptoms reviewed.        Meds ordered this encounter  Medications  . doxycycline (VIBRA-TABS) 100 MG tablet    Sig: Take 1 tablet (100 mg total) by mouth 2 (two) times daily.    Dispense:  20 tablet    Refill:  0    Order Specific Question:  Supervising Provider    Answer:  Janeann Forehand [[id number]]      Follow up plan: Return if symptoms worsen or fail to improve.

## 2015-05-03 LAB — CULTURE, URINE COMPREHENSIVE

## 2015-05-07 LAB — SPECIMEN STATUS REPORT

## 2015-05-07 LAB — GC/CHLAMYDIA PROBE AMP
Chlamydia trachomatis, NAA: NEGATIVE
Neisseria gonorrhoeae by PCR: NEGATIVE

## 2017-05-12 ENCOUNTER — Encounter: Payer: Self-pay | Admitting: Nurse Practitioner

## 2017-05-12 ENCOUNTER — Ambulatory Visit (INDEPENDENT_AMBULATORY_CARE_PROVIDER_SITE_OTHER): Payer: BLUE CROSS/BLUE SHIELD | Admitting: Nurse Practitioner

## 2017-05-12 VITALS — BP 113/68 | HR 78 | Temp 98.0°F | Ht 66.75 in | Wt 127.2 lb

## 2017-05-12 DIAGNOSIS — G43019 Migraine without aura, intractable, without status migrainosus: Secondary | ICD-10-CM

## 2017-05-12 DIAGNOSIS — R350 Frequency of micturition: Secondary | ICD-10-CM

## 2017-05-12 LAB — POCT URINALYSIS DIPSTICK
Bilirubin, UA: NEGATIVE
Blood, UA: NEGATIVE
Glucose, UA: NEGATIVE
Ketones, UA: NEGATIVE
Leukocytes, UA: NEGATIVE
Nitrite, UA: NEGATIVE
Protein, UA: NEGATIVE
Spec Grav, UA: 1.01 (ref 1.010–1.025)
Urobilinogen, UA: 0.2 E.U./dL
pH, UA: 6.5 (ref 5.0–8.0)

## 2017-05-12 IMAGING — MR MR ABDOMEN W/O CM
6 of 8 series · 34 of 48 positions shown · non-contrast
Comparison: Renal ultrasound 11/14/2014, CT abdomen/ pelvis
01/01/2009

CLINICAL DATA: Right renal mass on prior exam. Hematuria for 2
weeks. Patient refused contrast.

EXAM:
MRI ABDOMEN WITHOUT CONTRAST
TECHNIQUE: Multiplanar multisequence MR imaging was performed without the
administration of intravenous contrast.

[Series 2: T2 · coronal · 8.0mm · 0.78mm/px · 2 of 21 slices shown]
[im 1/21]
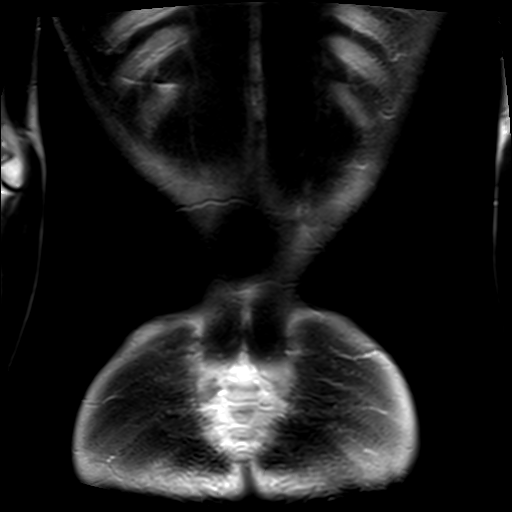
[im 21/21]
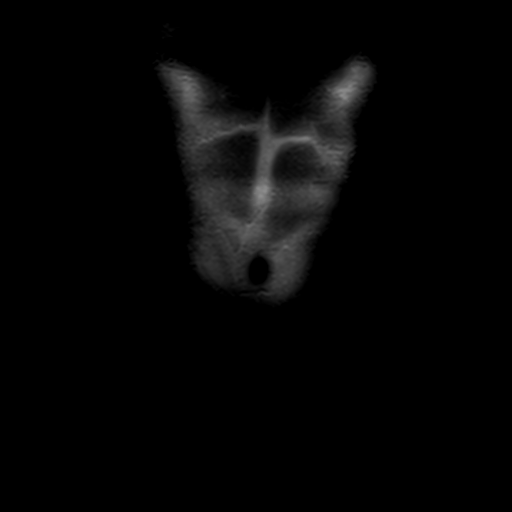

[Series 6: DWI · axial · 6.0mm · 1.98mm/px · z∈[-47,+162]mm · 9 of 90 slices shown]
[im 1/90]
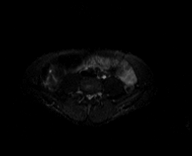
[im 17/90]
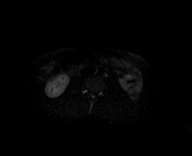
[im 25/90]
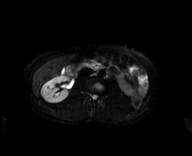
[im 41/90]
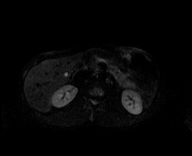
[im 49/90]
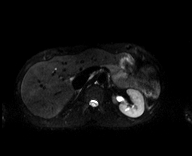
[im 65/90]
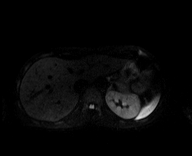
[im 73/90]
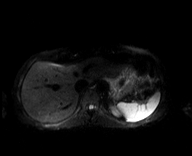
[im 81/90]
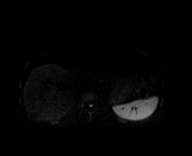
[im 90/90]
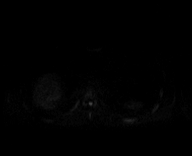

[Series 7: ax dwi_adc · axial · 6.0mm · 1.98mm/px · z∈[-47,+162]mm · 4 of 30 slices shown]
[im 1/30]
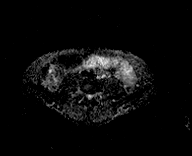
[im 10/30]
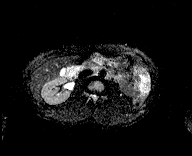
[im 20/30]
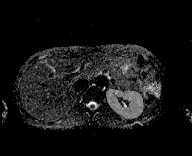
[im 30/30]
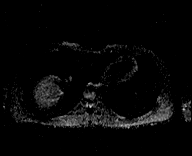

[Series 8: bSSFP · axial · 4.0mm · 0.74mm/px · z∈[-52,+164]mm · 7 of 55 slices shown]
[im 1/55]
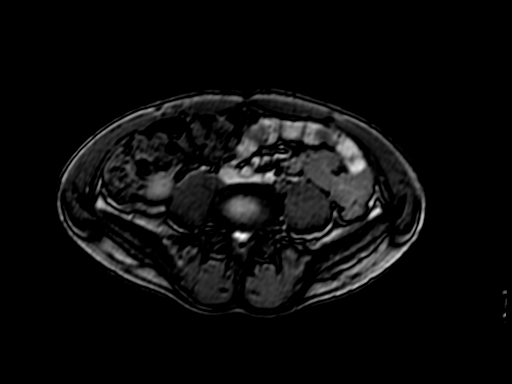
[im 10/55]
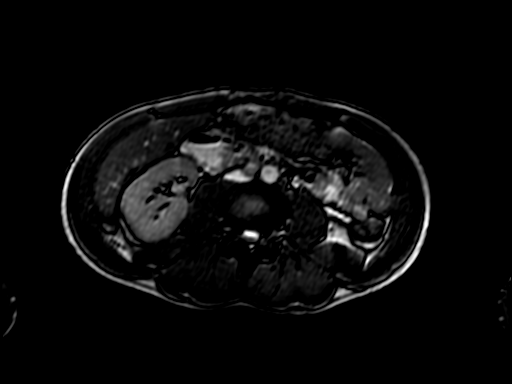
[im 19/55]
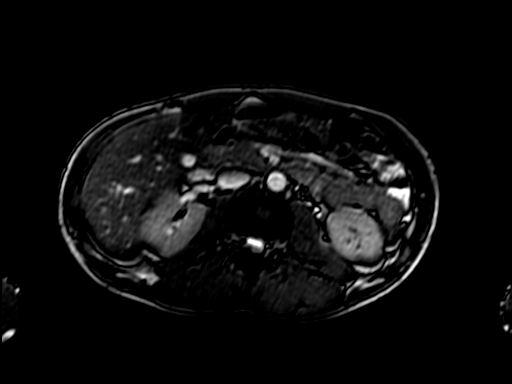
[im 28/55]
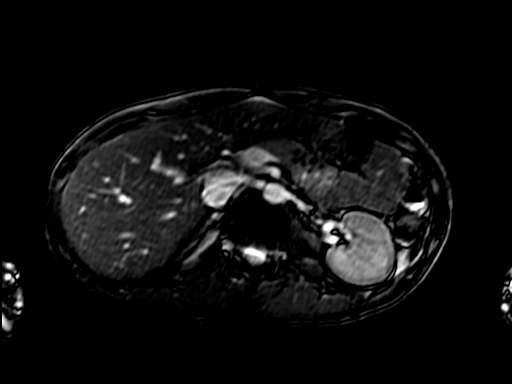
[im 37/55]
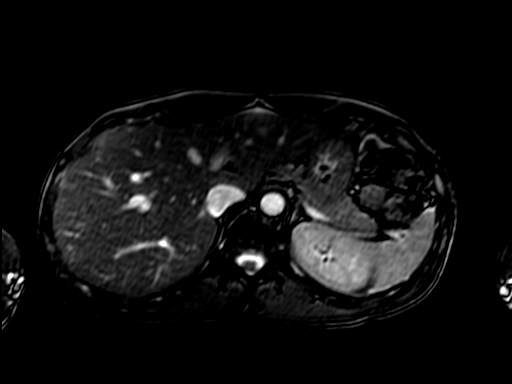
[im 46/55]
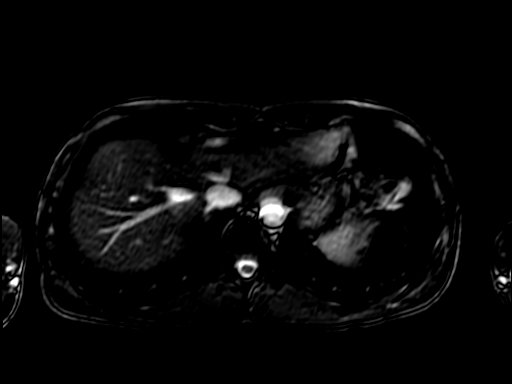
[im 55/55]
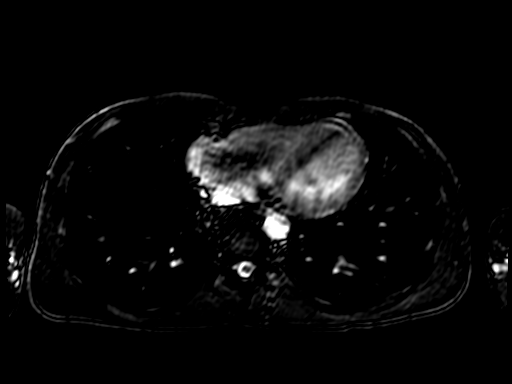

[Series 10: T2 fat-sat · axial · 7.0mm · 0.74mm/px · z∈[-70,+157]mm · 4 of 28 slices shown]
[im 1/28]
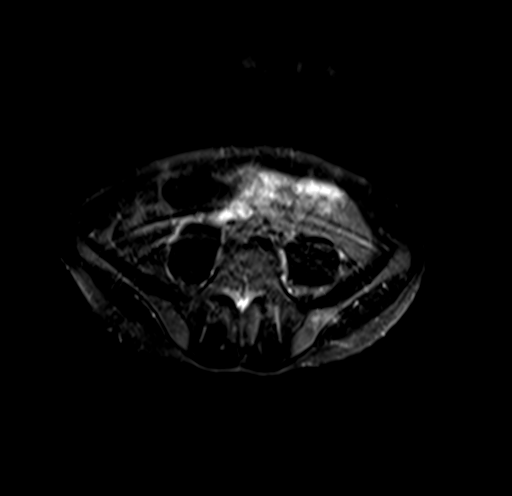
[im 10/28]
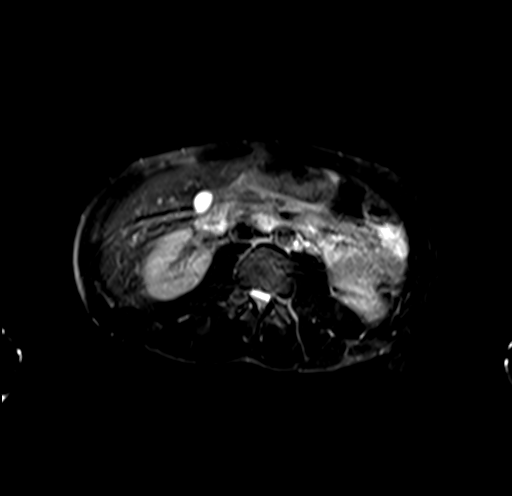
[im 19/28]
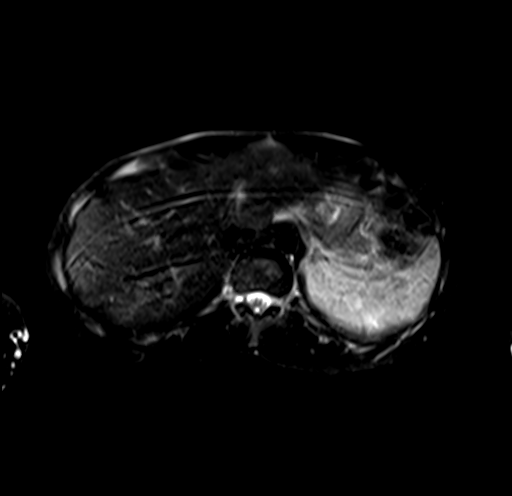
[im 28/28]
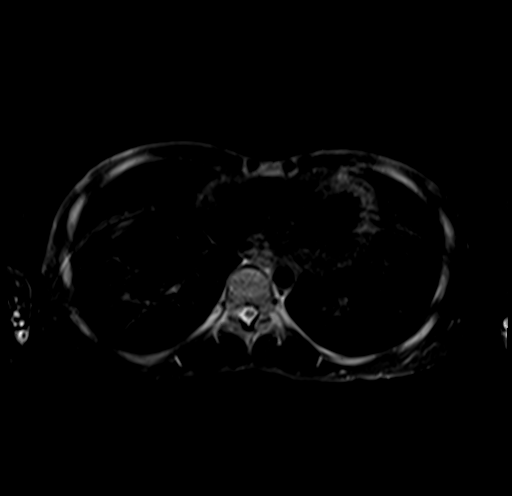

[Series 11: T1 dynamic fat-sat · axial · non-contrast · 2.5mm · 0.74mm/px · z∈[-67,+150]mm · 8 of 88 slices shown]
[im 1/88]
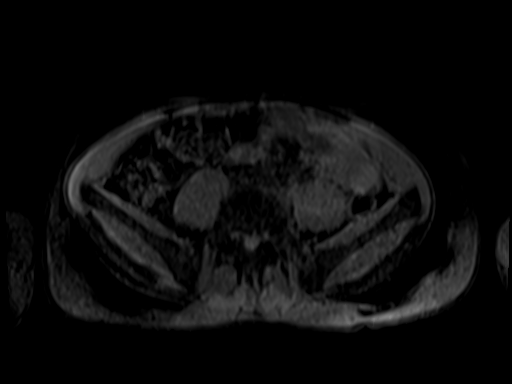
[im 18/88]
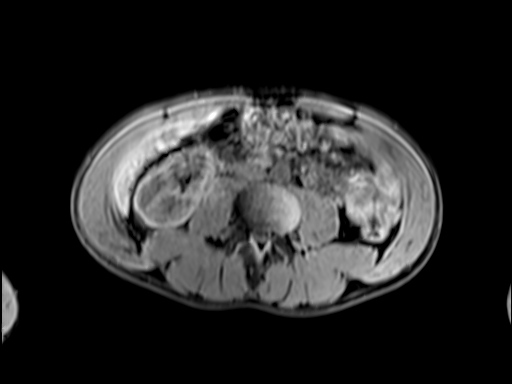
[im 27/88]
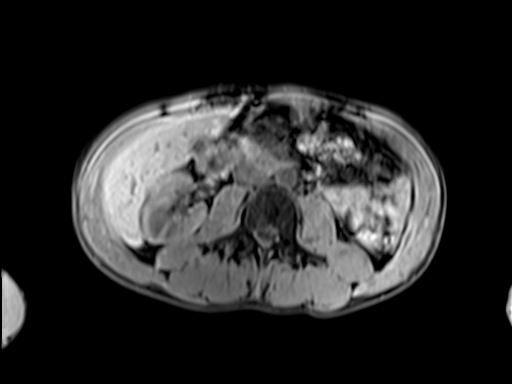
[im 35/88]
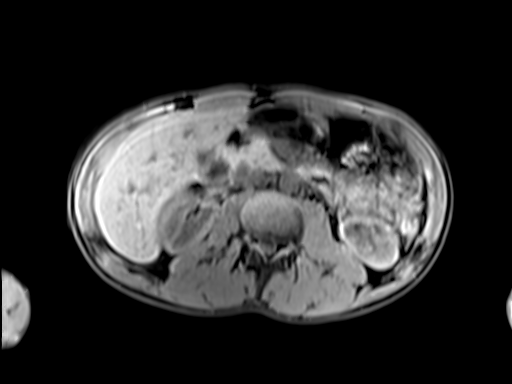
[im 53/88]
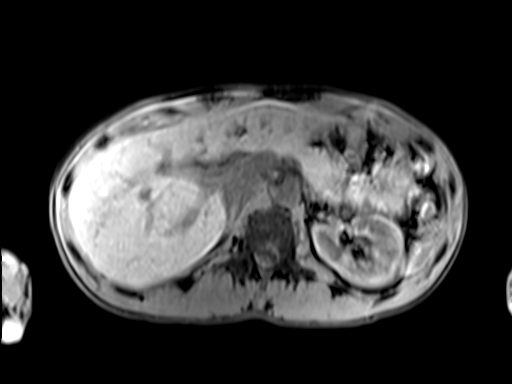
[im 61/88]
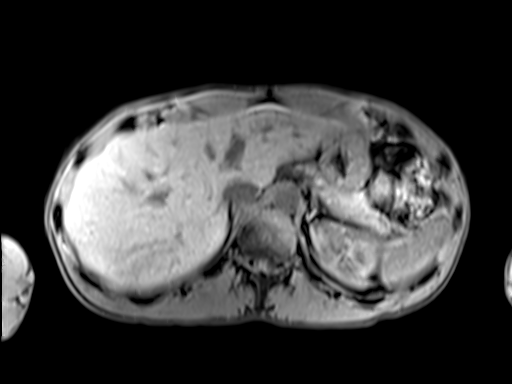
[im 70/88]
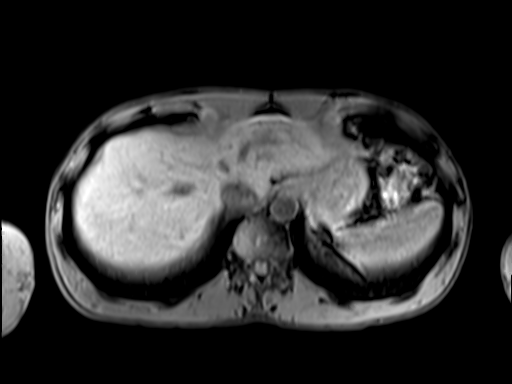
[im 88/88]
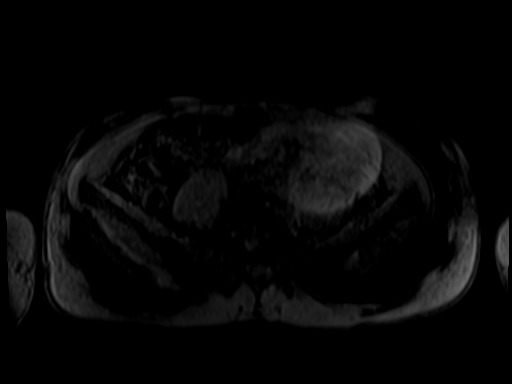

[34 of 48 positions shown; findings below may reference images not displayed]

FINDINGS: Lower chest:  Allowing for motion artifact, lung bases are clear.

Hepatobiliary: Motion artifact obscures detail. Grossly normal
appearance of the liver and gallbladder.

Pancreas: Not well visualized, unremarkable in visualized aspects.

Spleen: Grossly normal

Adrenals/Urinary Tract: 5 mm anterior right mid renal cortical T2
hyperintense presumed cyst again noted. This is stable. No
hydronephrosis. Adrenal glands are poorly visualized but grossly
unremarkable where seen.

Stomach/Bowel: Grossly normal

Vascular/Lymphatic: No aortic aneurysm.  No lymphadenopathy.

Other: No ascites. Trace if any peritoneal fluid is most likely an
incidental finding.

Musculoskeletal: Mild leftward curvature of the thoracolumbar spine
centered at L2. No acute osseous abnormality.
IMPRESSION: No renal mass identified to correlate with the questioned finding on
the prior exam. Stable 5 mm right mid anterior cortical cyst
reidentified.

Extensive motion degradation and suboptimal visualization without
contrast.

## 2017-05-12 MED ORDER — SUMATRIPTAN 5 MG/ACT NA SOLN: 1.0000 | NASAL | 0 refills | Status: DC | PRN

## 2017-05-12 MED ORDER — OXYBUTYNIN CHLORIDE ER 10 MG PO TB24: 10.0000 mg | ORAL_TABLET | Freq: Every day | ORAL | 5 refills | Status: DC

## 2017-05-12 NOTE — Patient Instructions (Addendum)
Thomas Burke, Thank you for coming in to clinic today.  1. Bladder diary, work on timed voids - Use the bathroom at least once every 3-4 hours.  Go at least 2 hours apart.  2. For your headaches: - Use medication only if needed. - START sumatriptan 5 mg nasal spray.  Use one spray in one nostril at start of migraine headache.  Repeat up to every 2 hours for a max of 3 doses in 24 hours.  Please schedule a follow-up appointment with Wilhelmina McardleLauren Jawanda Passey, AGNP. Return if symptoms worsen or fail to improve.  If you have any other questions or concerns, please feel free to call the clinic or send a message through MyChart. You may also schedule an earlier appointment if necessary.  You will receive a survey after today's visit either digitally by e-mail or paper by [employer] mail. Your experiences and feedback matter to us.  Please respond so we know how we are doing as we provide care for you.   Wilhelmina McardleLauren Kadden Osterhout, DNP, AGNP-BC Adult Gerontology Nurse Practitioner [facility], [facility]    The Male Pelvic Floor Muscles  The pelvic floor consists of several layers of muscles that cover the bottom of the pelvic cavity. These muscles have several distinct roles:  1. To support the pelvic organs, the bladder and colon within the pelvis. 2. To assist in stopping and starting the flow of urine or the passage of gas or stool. 3. To aid in sexual appreciation.    How to Locate the Pelvic Floor Muscles  The Urine Stop Test . At the midstream of your urine flow, squeeze the pelvic floor muscles. You should feel the sensation of the openings close and the muscles pulling the penis and anus up and in to the pelvic cavity.  If you have strong muscles you will slow or stop the stream of urine. . Try to stop or slow the flow of urine without tensing the muscles of your legs, buttocks. . Do this only to locate the muscles, not as a daily exercise. Feeling the Muscle . Place a fingertip on or into the  rectal opening.  Contract and lift the muscles as though you were holding back gas or a bowel movement.   . You will feel your anal opening tighten and your penis move slightly. Watching the Muscles Contract . Begin by lying on a flat surface.  Position yourself with your knees apart and bent with your head elevated and supported on several pillows.  Use a mirror to look at the anal opening and penis.  . Contract or tighten the muscles around the anal opening and watch for a puckering and lifting of the anus and slight movement of the penis.   . If you see a bulge of your anus this is an incorrect contraction and you should notify your health care provider for more instructions.   2007, [employer] Doc.12

## 2017-05-12 NOTE — Progress Notes (Signed)
Subjective:    Patient ID: RAJVIR ERNSTER, male    DOB: [birth year], 32 y.o.   MRN: [id number]  MONTIE SWIDERSKI is a 32 y.o. male presenting on 05/12/2017 for Polydipsia (urinary frequency x 1 week.  frequent headaches )   HPI Urinary Frequency  Patient presents today with polydipsia and polyuria worsening over the last week.  Patient has had symptoms intermittently over the last several months that resolved last Friday but increased again after that.  He describes increased urinary frequency after having had a bowel movement that is worse than other times of the day.  After bowel movements he has urge to urinate within 15 minutes of using the restroom that continues for about 2 hours after bowel movements.  He also reports leaking urine during his bowel movement, which he did not do prior to current symptoms. -Patient regularly drinks about 1-2 alcoholic beverages per day when getting off work.  He has started coming back, but is not noticing any improvement of urinary symptoms. - Working night shifts. - Leaving for [place] through July.  He is concerned about this because he does not want to have to get up frequently during air travel. -He denies incontinence, burning or dysuria, difficulty with erectile dysfunction or delayed ejaculation.   - Reducing caffeine intake has improved his urinary frequency.  IPSS Questionnaire (AUA-7): Over the past month.   1)  How often have you had a sensation of not emptying your bladder completely after you finish urinating?  3 - About half the time  2)  How often have you had to urinate again less than two hours after you finished urinating? 5 - Almost always  3)  How often have you found you stopped and started again several times when you urinated?  3 - About half the time  4) How difficult have you found it to postpone urination?  0 - Not at all  5) How often have you had a weak urinary stream?  0 - Not at all  6) How often have you had to push  or strain to begin urination?  4 - More than half the time  7) How many times did you most typically get up to urinate from the time you went to bed until the time you got up in the morning?  1 - 1 time  Total score:  0-7 mildly symptomatic   8-19 moderately symptomatic   20-35 severely symptomatic      Headaches Patient also reports frequent headaches.  He has 2-4 headaches per week.  Has had more frequent headaches over last 2 months.  About 1 time per month has a migraine that lastes all week.  Medications did not help.  Was able to continue work as normal except one day.  Migraines are usually associated with photophobia and phonophobia.  He occasionally has nausea. -OTC medications have helped.  Patient reports varying degree of assistance with different medications at different points in time.  Currently Advil migraine liquid capsules are helping.  He takes 400 mg per dose.  He is also used BC or Goody powder or 2 excedrin or advil. -Caffeine consumption was previously high.  Patient has reduced caffeine over the last 3-4 weeks.  This is not worsened his headaches.  Social History   Tobacco Use  . Smoking status: Current Every Day Smoker    Packs/day: 1.50    Years: 12.00    Pack years: 18.00    Types: Cigarettes  .  Smokeless tobacco: Never Used  Substance Use Topics  . Alcohol use: Yes    Alcohol/week: 0.0 oz  . Drug use: No    Review of Systems Per HPI unless specifically indicated above     Objective:    BP 113/68 (BP Location: Right Arm, Patient Position: Sitting, Cuff Size: Normal)   Pulse 78   Temp 98 F (36.7 C) (Oral)   Ht 5' 6.75" (1.695 m)   Wt 127 lb 3.2 oz (57.7 kg)   BMI 20.07 kg/m   Wt Readings from Last 3 Encounters:  05/12/17 127 lb 3.2 oz (57.7 kg)  04/30/15 121 lb (54.9 kg)  01/02/15 123 lb (55.8 kg)    Physical Exam  Constitutional: He is oriented to person, place, and time. He appears well-developed and well-nourished. No distress.  HENT:    Head: Normocephalic and atraumatic.  Right Ear: External ear normal.  Left Ear: External ear normal.  Nose: Nose normal.  Mouth/Throat: Oropharynx is clear and moist.  Eyes: Conjunctivae are normal. Pupils are equal, round, and reactive to light.  Cardiovascular: Normal rate, regular rhythm, normal heart sounds and intact distal pulses. Exam reveals no gallop and no friction rub.  No murmur heard. Pulmonary/Chest: Effort normal and breath sounds normal. No respiratory distress.  Abdominal: Soft. Bowel sounds are normal. He exhibits no distension. There is tenderness in the suprapubic area.  Musculoskeletal: Normal range of motion.  Neurological: He is alert and oriented to person, place, and time. No cranial nerve deficit.  Skin: Skin is warm and dry.  Psychiatric: He has a normal mood and affect. His speech is normal and behavior is normal. Judgment and thought content normal. Cognition and memory are normal.  Nursing note and vitals reviewed.    Results for orders placed or performed in visit on 05/12/17  POCT Urinalysis Dipstick  Result Value Ref Range   Color, UA yellow    Clarity, UA clear    Glucose, UA negative    Bilirubin, UA negative    Ketones, UA negative    Spec Grav, UA 1.010 1.010 - 1.025   Blood, UA negative    pH, UA 6.5 5.0 - 8.0   Protein, UA negative    Urobilinogen, UA 0.2 0.2 or 1.0 E.U./dL   Nitrite, UA negative    Leukocytes, UA Negative Negative   Appearance clear    Odor        Assessment & Plan:   Problem List Items Addressed This Visit    None    Visit Diagnoses    Urinary frequency    -  Primary Urinary frequency worst after defecation.  Symptoms most consistent with OAB and pelvic floor dysfunction.  Pt declined prostate exam despite explanation of importance to diagnosis.  UA today was negative for rule out of acute cystitis.  Plan: 1. Consider urology referral, but pt declines citing he has to travel outside of the [place] within the next 2  weeks. 2. START oxybutynin 10 mg 24 hr tablet once daily. 3. Start using timed voids and keep bladder diary to identify any triggers for symptoms. 4. START performing pelvic floor exercises to improve symptoms. Consider pelvic floor PT if needed. 5. Followup as needed an in July when he returns to [place].   Relevant Medications   oxybutynin (DITROPAN-XL) 10 MG 24 hr tablet   Other Relevant Orders   POCT Urinalysis Dipstick (Completed)   Intractable migraine without aura and without status migrainosus     Consistent with  persistent migraine HA with 1 per month lasting 3 days. - Currently without active HA, well-appearing, no focal neuro deficits, tolerating PO w/o n/v  Plan: 1. Start abortive therapy with Sumatriptan 5 mg /act nasal spray.  Use one spray in single nare once. May repeat dose within 2 hr if persistent, no more than 3 doses in 24 hours, in future can titrate dose upward if needed. Counseling on potential side effect / intolerance with chest discomfort acutely after taking sumatriptan. 2. Recommend taking Ibuprofen 600-800mg  q 8 hr PRN, and can try Tylenol 1000mg  TID alternatively 3. Avoid triggers including foods, caffeine. Important to rest. - Discussion on future migraine prophylaxis medications - handout given, review options at next visit, determine need if using sumatriptan frequently 4. Start headache diary, handout given, identify triggers for avoidance, bring to next visit 5. Return criteria given for acute migraine, when to go to office vs ED   Relevant Medications   aspirin-acetaminophen-caffeine (EXCEDRIN MIGRAINE) 250-250-65 MG tablet   ibuprofen (ADVIL,MOTRIN) 200 MG tablet   SUMAtriptan (IMITREX) 5 MG/ACT nasal spray      Meds ordered this encounter  Medications  . DISCONTD: oxybutynin (DITROPAN-XL) 10 MG 24 hr tablet    Sig: Take 1 tablet (10 mg total) by mouth at bedtime.    Dispense:  30 tablet    Refill:  5    Order Specific Question:   Supervising Provider      Answer:   Smitty CordsKARAMALEGOS, ALEXANDER J [[id number]]  . DISCONTD: SUMAtriptan (IMITREX) 5 MG/ACT nasal spray    Sig: Place 1 spray (5 mg total) into the nose every 2 (two) hours as needed for migraine.    Dispense:  1 Inhaler    Refill:  0    Order Specific Question:   Supervising Provider    Answer:   Smitty CordsKARAMALEGOS, ALEXANDER J [[id number]]  . SUMAtriptan (IMITREX) 5 MG/ACT nasal spray    Sig: Place 1 spray (5 mg total) into the nose every 2 (two) hours as needed for migraine.    Dispense:  1 Inhaler    Refill:  0    Order Specific Question:   Supervising Provider    Answer:   Smitty CordsKARAMALEGOS, ALEXANDER J [[id number]]  . oxybutynin (DITROPAN-XL) 10 MG 24 hr tablet    Sig: Take 1 tablet (10 mg total) by mouth at bedtime.    Dispense:  30 tablet    Refill:  5    Order Specific Question:   Supervising Provider    Answer:   Smitty CordsKARAMALEGOS, ALEXANDER J [[id number]]    Follow up plan: Return if symptoms worsen or fail to improve.  Wilhelmina McardleLauren Denell Cothern, DNP, AGPCNP-BC Adult Gerontology Primary Care Nurse Practitioner [facility] 05/12/2017, 5:17 PM

## 2017-05-16 ENCOUNTER — Other Ambulatory Visit: Payer: Self-pay

## 2017-05-16 DIAGNOSIS — G43019 Migraine without aura, intractable, without status migrainosus: Secondary | ICD-10-CM

## 2017-05-16 MED ORDER — SUMATRIPTAN 5 MG/ACT NA SOLN: 1.0000 | NASAL | 0 refills | Status: DC | PRN

## 2017-09-23 ENCOUNTER — Emergency Department
Admission: EM | Admit: 2017-09-23 | Discharge: 2017-09-23 | Disposition: A | Discharge status: Home / Self Care | Payer: 59 | Attending: Emergency Medicine | Admitting: Emergency Medicine

## 2017-09-23 ENCOUNTER — Emergency Department: Payer: 59

## 2017-09-23 DIAGNOSIS — F17210 Nicotine dependence, cigarettes, uncomplicated: Secondary | ICD-10-CM | POA: Insufficient documentation

## 2017-09-23 DIAGNOSIS — T50903A Poisoning by unspecified drugs, medicaments and biological substances, assault, initial encounter: Secondary | ICD-10-CM | POA: Insufficient documentation

## 2017-09-23 DIAGNOSIS — Z79899 Other long term (current) drug therapy: Secondary | ICD-10-CM | POA: Diagnosis not present

## 2017-09-23 DIAGNOSIS — R4020 Unspecified coma: Secondary | ICD-10-CM | POA: Diagnosis not present

## 2017-09-23 DIAGNOSIS — R5381 Other malaise: Secondary | ICD-10-CM | POA: Diagnosis not present

## 2017-09-23 LAB — CBC WITH DIFFERENTIAL/PLATELET
BASOS ABS: 0.1 10*3/uL (ref 0–0.1)
Basophils Relative: 1 %
Eosinophils Absolute: 0.1 10*3/uL (ref 0–0.7)
Eosinophils Relative: 1 %
HEMATOCRIT: 37.7 % — AB (ref 40.0–52.0)
HEMOGLOBIN: 13.7 g/dL (ref 13.0–18.0)
Lymphocytes Relative: 18 %
Lymphs Abs: 1.5 10*3/uL (ref 1.0–3.6)
MCH: 33.8 pg (ref 26.0–34.0)
MCHC: 36.3 g/dL — ABNORMAL HIGH (ref 32.0–36.0)
MCV: 93 fL (ref 80.0–100.0)
MONO ABS: 0.6 10*3/uL (ref 0.2–1.0)
Monocytes Relative: 8 %
NEUTROS ABS: 6.2 10*3/uL (ref 1.4–6.5)
Neutrophils Relative %: 72 %
Platelets: 250 10*3/uL (ref 150–440)
RBC: 4.05 MIL/uL — AB (ref 4.40–5.90)
RDW: 13 % (ref 11.5–14.5)
WBC: 8.6 10*3/uL (ref 3.8–10.6)

## 2017-09-23 LAB — COMPREHENSIVE METABOLIC PANEL
ALT: 10 U/L (ref 0–44)
AST: 18 U/L (ref 15–41)
Albumin: 4.7 g/dL (ref 3.5–5.0)
Alkaline Phosphatase: 40 U/L (ref 38–126)
Anion gap: 7 (ref 5–15)
BILIRUBIN TOTAL: 0.6 mg/dL (ref 0.3–1.2)
BUN: 15 mg/dL (ref 6–20)
CO2: 28 mmol/L (ref 22–32)
Calcium: 8.9 mg/dL (ref 8.9–10.3)
Chloride: 104 mmol/L (ref 98–111)
Creatinine, Ser: 0.97 mg/dL (ref 0.61–1.24)
GLUCOSE: 110 mg/dL — AB (ref 70–99)
POTASSIUM: 3.8 mmol/L (ref 3.5–5.1)
Sodium: 139 mmol/L (ref 135–145)
TOTAL PROTEIN: 7.4 g/dL (ref 6.5–8.1)

## 2017-09-23 LAB — URINE DRUG SCREEN, QUALITATIVE (ARMC ONLY)
Amphetamines, Ur Screen: NOT DETECTED
BENZODIAZEPINE, UR SCRN: POSITIVE — AB
CANNABINOID 50 NG, UR ~~LOC~~: POSITIVE — AB
COCAINE METABOLITE, UR ~~LOC~~: NOT DETECTED
MDMA (Ecstasy)Ur Screen: NOT DETECTED
METHADONE SCREEN, URINE: NOT DETECTED
OPIATE, UR SCREEN: NOT DETECTED
PHENCYCLIDINE (PCP) UR S: NOT DETECTED
Tricyclic, Ur Screen: NOT DETECTED

## 2017-09-23 LAB — SALICYLATE LEVEL

## 2017-09-23 LAB — ETHANOL

## 2017-09-23 LAB — ACETAMINOPHEN LEVEL

## 2017-09-23 LAB — RAPID HIV SCREEN (HIV 1/2 AB+AG)
HIV 1/2 ANTIBODIES: NONREACTIVE
HIV-1 P24 Antigen - HIV24: NONREACTIVE

## 2017-09-23 LAB — CK: Total CK: 69 U/L (ref 49–397)

## 2017-09-23 MED ORDER — SODIUM CHLORIDE 0.9 % IV BOLUS
1000.0000 mL | Freq: Once | INTRAVENOUS | Status: AC
  Administered 2017-09-23: 1000 mL via INTRAVENOUS

## 2017-09-23 MED ORDER — MIDAZOLAM HCL 5 MG/5ML IJ SOLN
5.0000 mg | Freq: Once | INTRAMUSCULAR | Status: AC
  Administered 2017-09-23: 5 mg via INTRAMUSCULAR

## 2017-09-23 MED ORDER — FUROSEMIDE 10 MG/ML IJ SOLN: 10.0000 mg | Freq: Once | INTRAMUSCULAR | Status: DC

## 2017-09-23 MED ORDER — MIDAZOLAM HCL 5 MG/5ML IJ SOLN
INTRAMUSCULAR | Status: AC
  Filled 2017-09-23: qty 5

## 2017-09-23 NOTE — ED Notes (Addendum)
Pt refused care from this RN at initial triage. "Don't you touch me with a needle" "get the fuck away from me" "I want you to get the fuck out"   MD and Charge RN aware.   MD orders to give medication when pt returns from CT

## 2017-09-23 NOTE — ED Notes (Signed)
Patient is resting comfortably at this time with no signs of distress present. Equal, unlabored rise and fall of chest noted within normal rate. VS stable. Family at bedside. Will continue to monitor.

## 2017-09-23 NOTE — ED Notes (Signed)
Patient is resting comfortably at this time with no signs of distress present. Equal, unlabored rise and fall of chest noted within normal rate. VS stable. Will continue to monitor.   

## 2017-09-23 NOTE — Discharge Instructions (Signed)
Fortunately today your blood work was reassuring.  It does seem like you are probably given cannabis or a synthetic cannabinoid although it is difficult to say for certain.  Please get a lot of rest today and make sure you remain well-hydrated.  Follow-up with your primary care physician as needed and return to the emergency department for any concerns.  It was a pleasure to take care of you today, and thank you for coming to our emergency department.  If you have any questions or concerns before leaving please ask the nurse to grab me and I'm more than happy to go through your aftercare instructions again.  If you were prescribed any opioid pain medication today such as Norco, Vicodin, Percocet, morphine, hydrocodone, or oxycodone please make sure you do not drive when you are taking this medication as it can alter your ability to drive safely.  If you have any concerns once you are home that you are not improving or are in fact getting worse before you can make it to your follow-up appointment, please do not hesitate to call [phone] and come back for further evaluation.  Merrily BrittleNeil Shloima Clinch, MD  Results for orders placed or performed during the hospital encounter of 09/23/17  Comprehensive metabolic panel  Result Value Ref Range   Sodium 139 135 - 145 mmol/L   Potassium 3.8 3.5 - 5.1 mmol/L   Chloride 104 98 - 111 mmol/L   CO2 28 22 - 32 mmol/L   Glucose, Bld 110 (H) 70 - 99 mg/dL   BUN 15 6 - 20 mg/dL   Creatinine, Ser 1.610.97 0.61 - 1.24 mg/dL   Calcium 8.9 8.9 - 09.610.3 mg/dL   Total Protein 7.4 6.5 - 8.1 g/dL   Albumin 4.7 3.5 - 5.0 g/dL   AST 18 15 - 41 U/L   ALT 10 0 - 44 U/L   Alkaline Phosphatase 40 38 - 126 U/L   Total Bilirubin 0.6 0.3 - 1.2 mg/dL   GFR calc non Af Amer >60 >60 mL/min   GFR calc Af Amer >60 >60 mL/min   Anion gap 7 5 - 15  CBC with Differential  Result Value Ref Range   WBC 8.6 3.8 - 10.6 K/uL   RBC 4.05 (L) 4.40 - 5.90 MIL/uL   Hemoglobin 13.7 13.0 - 18.0 g/dL   HCT  04.537.7 (L) 40.940.0 - 52.0 %   MCV 93.0 80.0 - 100.0 fL   MCH 33.8 26.0 - 34.0 pg   MCHC 36.3 (H) 32.0 - 36.0 g/dL   RDW 81.113.0 91.411.5 - 78.214.5 %   Platelets 250 150 - 440 K/uL   Neutrophils Relative % 72 %   Neutro Abs 6.2 1.4 - 6.5 K/uL   Lymphocytes Relative 18 %   Lymphs Abs 1.5 1.0 - 3.6 K/uL   Monocytes Relative 8 %   Monocytes Absolute 0.6 0.2 - 1.0 K/uL   Eosinophils Relative 1 %   Eosinophils Absolute 0.1 0 - 0.7 K/uL   Basophils Relative 1 %   Basophils Absolute 0.1 0 - 0.1 K/uL  CK  Result Value Ref Range   Total CK 69 49 - 397 U/L  Rapid HIV screen (HIV 1/2 Ab+Ag)  Result Value Ref Range   HIV-1 P24 Antigen - HIV24 NON REACTIVE NON REACTIVE   HIV 1/2 Antibodies NON REACTIVE NON REACTIVE   Interpretation (HIV Ag Ab)      A non reactive test result means that HIV 1 or HIV 2 antibodies and HIV  1 p24 antigen were not detected in the specimen.  Acetaminophen level  Result Value Ref Range   Acetaminophen (Tylenol), Serum <10 (L) 10 - 30 ug/mL  Ethanol  Result Value Ref Range   Alcohol, Ethyl (B) <10 <10 mg/dL  Salicylate level  Result Value Ref Range   Salicylate Lvl <7.0 2.8 - 30.0 mg/dL   Ct Head Wo Contrast  Result Date: 09/23/2017 CLINICAL DATA:  Altered level of consciousness. EXAM: CT HEAD WITHOUT CONTRAST TECHNIQUE: Contiguous axial images were obtained from the base of the skull through the vertex without intravenous contrast. COMPARISON:  None. FINDINGS: BRAIN: The ventricles and sulci are normal. No intraparenchymal hemorrhage, mass effect nor midline shift. No acute large vascular territory infarcts. Grey-white matter distinction is maintained. The basal ganglia are unremarkable. No abnormal extra-axial fluid collections. Basal cisterns are not effaced and midline. The brainstem and cerebellar hemispheres are without acute abnormalities. VASCULAR: Unremarkable. SKULL/SOFT TISSUES: No skull fracture. No significant soft tissue swelling. ORBITS/SINUSES: The included ocular  globes and orbital contents are normal.The mastoid air cells are clear. The included paranasal sinuses are well-aerated. OTHER: None. IMPRESSION: Normal head CT without acute intracranial abnormality. Electronically Signed   By: Tollie Eth M.D.   On: 09/23/2017 03:17
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
## 2017-09-23 NOTE — ED Notes (Signed)
Patient is laying comfortably in stretcher at this time with no signs of distress present. talking with mother. VS stable. Will continue to monitor.

## 2017-11-08 ENCOUNTER — Telehealth: Payer: Self-pay

## 2017-11-08 DIAGNOSIS — R350 Frequency of micturition: Secondary | ICD-10-CM

## 2017-11-08 MED ORDER — OXYBUTYNIN CHLORIDE ER 10 MG PO TB24: 10.0000 mg | ORAL_TABLET | Freq: Every day | ORAL | 5 refills | Status: DC

## 2017-11-08 NOTE — Telephone Encounter (Signed)
Refill request

## 2017-12-04 ENCOUNTER — Other Ambulatory Visit: Payer: Self-pay | Admitting: Nurse Practitioner

## 2017-12-04 DIAGNOSIS — R350 Frequency of micturition: Secondary | ICD-10-CM

## 2018-03-03 ENCOUNTER — Other Ambulatory Visit: Payer: Self-pay | Admitting: Nurse Practitioner

## 2018-03-03 DIAGNOSIS — R350 Frequency of micturition: Secondary | ICD-10-CM

## 2018-04-02 ENCOUNTER — Telehealth: Payer: Self-pay | Admitting: Nurse Practitioner

## 2018-04-02 DIAGNOSIS — R350 Frequency of micturition: Secondary | ICD-10-CM

## 2018-04-02 MED ORDER — OXYBUTYNIN CHLORIDE ER 10 MG PO TB24: 10.0000 mg | ORAL_TABLET | Freq: Every day | ORAL | 0 refills | Status: DC

## 2018-04-02 NOTE — Telephone Encounter (Signed)
Pt called requesting refill on oxybutynin called into [facility]. Pt call back # is [phone]

## 2018-04-02 NOTE — Telephone Encounter (Signed)
Patient needs to schedule a follow-up appointment by March for additional refills.

## 2018-04-27 ENCOUNTER — Other Ambulatory Visit: Payer: Self-pay | Admitting: Nurse Practitioner

## 2018-04-27 DIAGNOSIS — Z20828 Contact with and (suspected) exposure to other viral communicable diseases: Secondary | ICD-10-CM

## 2018-04-27 MED ORDER — OSELTAMIVIR PHOSPHATE 75 MG PO CAPS
75.0000 mg | ORAL_CAPSULE | Freq: Every day | ORAL | 0 refills | Status: AC
Start: 2018-04-27 — End: 2018-05-07

## 2018-05-06 ENCOUNTER — Other Ambulatory Visit: Payer: Self-pay | Admitting: Nurse Practitioner

## 2018-05-06 DIAGNOSIS — R350 Frequency of micturition: Secondary | ICD-10-CM

## 2018-05-07 ENCOUNTER — Other Ambulatory Visit: Payer: Self-pay | Admitting: Nurse Practitioner

## 2018-05-07 DIAGNOSIS — R350 Frequency of micturition: Secondary | ICD-10-CM

## 2018-05-07 NOTE — Telephone Encounter (Signed)
Pt needs a refill on oxybutynin.  His call back [phone]

## 2018-05-08 ENCOUNTER — Other Ambulatory Visit: Payer: Self-pay

## 2018-05-08 DIAGNOSIS — R350 Frequency of micturition: Secondary | ICD-10-CM

## 2018-05-08 MED ORDER — OXYBUTYNIN CHLORIDE ER 10 MG PO TB24: 10.0000 mg | ORAL_TABLET | Freq: Every day | ORAL | 2 refills | Status: DC

## 2018-08-03 ENCOUNTER — Ambulatory Visit (INDEPENDENT_AMBULATORY_CARE_PROVIDER_SITE_OTHER): Payer: BLUE CROSS/BLUE SHIELD | Admitting: Family Medicine

## 2018-08-03 ENCOUNTER — Other Ambulatory Visit (HOSPITAL_COMMUNITY)
Admission: RE | Admit: 2018-08-03 | Discharge: 2018-08-03 | Disposition: A | Discharge status: Home / Self Care | Payer: 59 | Source: Ambulatory Visit | Attending: Family Medicine | Admitting: Family Medicine

## 2018-08-03 ENCOUNTER — Encounter: Payer: Self-pay | Admitting: Family Medicine

## 2018-08-03 ENCOUNTER — Other Ambulatory Visit: Payer: Self-pay

## 2018-08-03 VITALS — BP 113/67 | HR 74 | Temp 97.9°F | Ht 66.0 in | Wt 125.0 lb

## 2018-08-03 DIAGNOSIS — R300 Dysuria: Secondary | ICD-10-CM

## 2018-08-03 DIAGNOSIS — R350 Frequency of micturition: Secondary | ICD-10-CM | POA: Diagnosis not present

## 2018-08-03 DIAGNOSIS — B3749 Other urogenital candidiasis: Secondary | ICD-10-CM

## 2018-08-03 LAB — POCT URINALYSIS DIPSTICK
Bilirubin, UA: NEGATIVE
Blood, UA: NEGATIVE
Glucose, UA: NEGATIVE
Ketones, UA: NEGATIVE
Leukocytes, UA: NEGATIVE
Nitrite, UA: NEGATIVE
Protein, UA: NEGATIVE
Spec Grav, UA: 1.015 (ref 1.010–1.025)
Urobilinogen, UA: 0.2 E.U./dL
pH, UA: 5 (ref 5.0–8.0)

## 2018-08-03 MED ORDER — FLUCONAZOLE 150 MG PO TABS: ORAL_TABLET | ORAL | 0 refills | Status: DC

## 2018-08-03 NOTE — Progress Notes (Signed)
Subjective:    Patient ID: Thomas Burke, male    DOB: 04/15/1985, 33 y.o.   MRN: [id number]  Thomas Burke is a 33 y.o. male presenting on 08/03/2018 for Urinary Frequency (urinary frequency, urgency and dysuria only once during urination . Pt notice some redness at the head of his penis, denies swelling. x 1 week )  PCP is Wilhelmina Mcardle, AGPCNP-BC - I am currently covering during her maternity leave.   HPI  DYSURIA / URINARY FREQUENCY Reports symptoms started about 1 week ago, he developed burning with urinating at first, then developed urinary frequency, then had increased nocturia. He said that he noticed that end of his penis / urethra looked "red" or inflamed. Also admitted one episode of a clear small amount of scant discharge prior to urination yesterday. - History of prior gonorrhea years ago, since treated and resolved. He was last tested in 2017 negative. He said that his wife has history of BV and Yeast vaginitis. Recent onset symptoms after sexual intercourse. - Denies any genital rash or ulceration, abdominal pain or penile pain or flank pain, urinary blood or cloudy or odor, nausea vomiting, fever chills   No flowsheet data found.  Social History   Tobacco Use  . Smoking status: Current Every Day Smoker    Packs/day: 1.50    Years: 12.00    Pack years: 18.00    Types: Cigarettes  . Smokeless tobacco: Never Used  Substance Use Topics  . Alcohol use: Yes    Alcohol/week: 0.0 standard drinks  . Drug use: No    Review of Systems Per HPI unless specifically indicated above     Objective:    BP 113/67 (BP Location: Right Arm, Patient Position: Sitting, Cuff Size: Normal)   Pulse 74   Temp 97.9 F (36.6 C) (Oral)   Ht 5\' 6"  (1.676 m)   Wt 125 lb (56.7 kg)   BMI 20.18 kg/m   Wt Readings from Last 3 Encounters:  08/03/18 125 lb (56.7 kg)  05/12/17 127 lb 3.2 oz (57.7 kg)  04/30/15 121 lb (54.9 kg)    Physical Exam Vitals signs and nursing note  reviewed.  Constitutional:      General: He is not in acute distress.    Appearance: He is well-developed. He is not diaphoretic.     Comments: Well-appearing, comfortable, cooperative  HENT:     Head: Normocephalic and atraumatic.  Eyes:     General:        Right eye: No discharge.        Left eye: No discharge.     Conjunctiva/sclera: Conjunctivae normal.  Cardiovascular:     Rate and Rhythm: Normal rate.  Pulmonary:     Effort: Pulmonary effort is normal.  Genitourinary:    Comments: Minimal erythema at tip of penis urethra, no discharge, circumcised, normal male genitalia otherwise, non tender no edema Skin:    General: Skin is warm and dry.     Findings: No erythema or rash.  Neurological:     Mental Status: He is alert and oriented to person, place, and time.  Psychiatric:        Behavior: Behavior normal.     Comments: Well groomed, good eye contact, normal speech and thoughts      Results for orders placed or performed in visit on 08/03/18  POCT Urinalysis Dipstick  Result Value Ref Range   Color, UA yellow    Clarity, UA clear    Glucose,  UA Negative Negative   Bilirubin, UA negative    Ketones, UA negative`    Spec Grav, UA 1.015 1.010 - 1.025   Blood, UA negative    pH, UA 5.0 5.0 - 8.0   Protein, UA Negative Negative   Urobilinogen, UA 0.2 0.2 or 1.0 E.U./dL   Nitrite, UA negative    Leukocytes, UA Negative Negative   Appearance     Odor        Assessment & Plan:   Problem List Items Addressed This Visit    None    Visit Diagnoses    Urinary frequency    -  Primary   Relevant Orders   POCT Urinalysis Dipstick (Completed)   Urine Culture   GC/Chlamydia probe amp ([place])not at [facility]   Dysuria       Relevant Orders   Urine Culture   GC/Chlamydia probe amp ([place])not at [facility]   Yeast dermatitis of penis       Relevant Medications   fluconazole (DIFLUCAN) 150 MG tablet      Seems gradually improved now Clinically with UTI symptoms  vs possible urethritis, with some local minimal erythema question possible developing yeast dermatitis given contact with wife current yeast infection - Prior history of gonorrhea, but since negative test 2017, no new reported sexual partners - No other evidence of STI or other concerns, recent updated negative STI screening HIV negative  Plan - UA today reviewed negative - Ordered Urine Culture and Urine GC Chlamydia for additional diagnostic, to rule out - Trial on Diflucan empiric 150mg  x 1 dose then repeat on Day 3 for possible yeast - Follow-up if not improving  Meds ordered this encounter  Medications  . fluconazole (DIFLUCAN) 150 MG tablet    Sig: Take one tablet by mouth on Day 1. Repeat dose 2nd tablet on Day 3.    Dispense:  2 tablet    Refill:  0    Follow up plan: Return in about 1 week (around 08/10/2018), or if symptoms worsen or fail to improve, for UTI.  [name], DO [facility] 08/03/2018, 10:50 AM

## 2018-08-03 NOTE — Patient Instructions (Addendum)
Thank you for coming to the office today.  Take Yeast pill treatment just in case - as discussed can cause the skin irritation and burning symptoms. Can be worse with increased moisture as well, try to stay dry.  Urine testing for bacterial infection and STI with gonorrhea/chlamdyia again to be safe to rule these issues out  Initial urine test was normal today in office.  Please schedule a Follow-up Appointment to: Return in about 1 week (around 08/10/2018), or if symptoms worsen or fail to improve, for UTI.  If you have any other questions or concerns, please feel free to call the office or send a message through MyChart. You may also schedule an earlier appointment if necessary.  Additionally, you may be receiving a survey about your experience at our office within a few days to 1 week by e-mail or mail. We value your feedback.  Saralyn Pilar, DO [facility], [place]

## 2018-08-04 LAB — URINE CULTURE
MICRO NUMBER:: 518976
Result:: NO GROWTH
SPECIMEN QUALITY:: ADEQUATE

## 2018-08-07 LAB — GC/CHLAMYDIA PROBE AMP (~~LOC~~) NOT AT ARMC
Chlamydia: NEGATIVE
Neisseria Gonorrhea: NEGATIVE

## 2019-07-09 ENCOUNTER — Encounter: Payer: Self-pay | Admitting: Family Medicine

## 2019-07-09 ENCOUNTER — Ambulatory Visit (INDEPENDENT_AMBULATORY_CARE_PROVIDER_SITE_OTHER): Payer: Self-pay | Admitting: Family Medicine

## 2019-07-09 ENCOUNTER — Other Ambulatory Visit: Payer: Self-pay

## 2019-07-09 ENCOUNTER — Other Ambulatory Visit (HOSPITAL_COMMUNITY)
Admission: RE | Admit: 2019-07-09 | Discharge: 2019-07-09 | Disposition: A | Discharge status: Home / Self Care | Payer: 59 | Source: Ambulatory Visit | Attending: Family Medicine | Admitting: Family Medicine

## 2019-07-09 VITALS — BP 120/68 | HR 92 | Temp 97.8°F | Resp 16 | Ht 66.0 in | Wt 132.0 lb

## 2019-07-09 DIAGNOSIS — R369 Urethral discharge, unspecified: Secondary | ICD-10-CM

## 2019-07-09 DIAGNOSIS — A599 Trichomoniasis, unspecified: Secondary | ICD-10-CM

## 2019-07-09 LAB — POCT URINALYSIS DIPSTICK
Bilirubin, UA: NEGATIVE
Glucose, UA: NEGATIVE
Ketones, UA: NEGATIVE
Leukocytes, UA: NEGATIVE
Nitrite, UA: NEGATIVE
Protein, UA: NEGATIVE
Spec Grav, UA: 1.01 (ref 1.010–1.025)
Urobilinogen, UA: 0.2 E.U./dL
pH, UA: 8 (ref 5.0–8.0)

## 2019-07-09 MED ORDER — METRONIDAZOLE 500 MG PO TABS: 500.0000 mg | ORAL_TABLET | Freq: Two times a day (BID) | ORAL | 0 refills | Status: DC

## 2019-07-09 NOTE — Addendum Note (Signed)
Addended by: Smitty Cords on: 07/09/2019 05:45 PM   Modules accepted: Orders

## 2019-07-09 NOTE — Patient Instructions (Addendum)
Thank you for coming to the office today.  Stay tuned for apt - they will call. Checking STD urine test Gonorrhea Chlamydia and urine culture today.  [facility] -1st floor [address] [place],  [place]  [address] Phone: [phone]  Urine dipstick only trace blood RBC  Please schedule a Follow-up Appointment to: Return if symptoms worsen or fail to improve, for penile discharge.  If you have any other questions or concerns, please feel free to call the office or send a message through MyChart. You may also schedule an earlier appointment if necessary.  Additionally, you may be receiving a survey about your experience at our office within a few days to 1 week by e-mail or mail. We value your feedback.  Saralyn Pilar, DO [facility], [place]

## 2019-07-09 NOTE — Progress Notes (Addendum)
Subjective:    Patient ID: Thomas Burke, male    DOB: 1986/02/11, 34 y.o.   MRN: [id number]  Thomas Burke is a 34 y.o. male presenting on 07/09/2019 for Urinary Tract Infection (onset yesterday as per pt had taken 5 different allegy pills might have cause UTI Sxs )  Patient presents for a same day appointment.  HPI   URINARY DISCHARGE / history of gross hematuria Yesterday onset random/sudden onset urinary discharge, was urinating normally in afternoon and then his urinary flow changed to thicker discharge, described yellowish thicker discharge small to moderate amount, and had a dripping or dribbling amount of discharge - Admits R eye itching, stinging - dry - thought to be more allergy related. He took multiple allergy meds - he was asking about STD affecting his eye. - He has had history of gonorrhea in past that was treated. No recent diagnosis of STD. - He had prior visit with me 07/2018 for similar issue with possible UTI vs discharge, had negative STD/Chlamydia/GC Screening and Negative urine culture as well. He was treated with diflucan and problem was already resolving however it seems. - he has documented history also of 2016 - previously with [facility] referred by previous PCP for Gross Hematuria, at that time he had work up of cystoscopy and CT imaging without identified source of bleeding - Denies any dysuria, penile pain, gross hematuria, fever chills, urethritis, urinary frequency    Depression screen [facility] 2/9 07/09/2019  Decreased Interest 0  Down, Depressed, Hopeless 0  PHQ - 2 Score 0    Social History   Tobacco Use  . Smoking status: Current Every Day Smoker    Packs/day: 1.50    Years: 12.00    Pack years: 18.00    Types: Cigarettes  . Smokeless tobacco: Never Used  Substance Use Topics  . Alcohol use: Yes    Alcohol/week: 0.0 standard drinks  . Drug use: No    Review of Systems Per HPI unless specifically indicated above     Objective:    BP  120/68   Pulse 92   Temp 97.8 F (36.6 C) (Temporal)   Resp 16   Ht 5\' 6"  (1.676 m)   Wt 132 lb (59.9 kg)   BMI 21.31 kg/m   Wt Readings from Last 3 Encounters:  07/09/19 132 lb (59.9 kg)  08/03/18 125 lb (56.7 kg)  05/12/17 127 lb 3.2 oz (57.7 kg)    Physical Exam Vitals and nursing note reviewed.  Constitutional:      General: He is not in acute distress.    Appearance: He is well-developed. He is not diaphoretic.     Comments: Well-appearing, comfortable, cooperative  HENT:     Head: Normocephalic and atraumatic.  Eyes:     General:        Right eye: No discharge.        Left eye: No discharge.     Conjunctiva/sclera: Conjunctivae normal.  Cardiovascular:     Rate and Rhythm: Normal rate.  Pulmonary:     Effort: Pulmonary effort is normal.  Genitourinary:    Comments: Declined genital examtoday Skin:    General: Skin is warm and dry.     Findings: No erythema or rash.  Neurological:     Mental Status: He is alert and oriented to person, place, and time.  Psychiatric:        Behavior: Behavior normal.     Comments: Well groomed, good eye contact, normal speech  and thoughts      I have personally reviewed the radiology report from 12/25/14  CT Abdomen Pelvis W Wo ContrastPerformed 12/25/2014 Final result  Study Result CLINICAL DATA: 34 year old male with history of gross hematuria for 2 weeks.  EXAM: CT ABDOMEN AND PELVIS WITHOUT AND WITH CONTRAST  TECHNIQUE: Multidetector CT imaging of the abdomen and pelvis was performed following the standard protocol before and following the bolus administration of intravenous contrast.  CONTRAST: 168mL OMNIPAQUE IOHEXOL 350 MG/ML SOLN  COMPARISON: CT the abdomen and pelvis 01/01/2009.  FINDINGS: Lower chest: Unremarkable.  Hepatobiliary: No suspicious appearing cystic or solid hepatic lesions are identified. No intra or extrahepatic biliary ductal dilatation. Gallbladder is normal in appearance.  Pancreas:  No pancreatic mass. No pancreatic ductal dilatation. No pancreatic or peripancreatic fluid or inflammatory changes.  Spleen: Unremarkable.  Adrenals/Urinary Tract: No calcifications are identified within the collecting system of either kidney, along the course of either ureter, or within the lumen of the urinary bladder. No hydroureteronephrosis or perinephric stranding to indicate urinary tract obstruction at this time. A few sub cm low-attenuation lesions are noted in the kidneys bilaterally, too small to definitively characterize, but statistically likely tiny cysts. Delayed post-contrast images demonstrate no definite filling defect within the collecting system of either kidney, along the course of either ureter, or within the lumen of the urinary bladder to strongly suggest presence of a urothelial neoplasm at this time. Urinary bladder is normal in appearance. Bilateral adrenal glands are normal in appearance.  Stomach/Bowel: Normal appearance of the stomach. No pathologic dilatation of small bowel or colon. Normal appendix.  Vascular/Lymphatic: No significant atherosclerotic disease, aneurysm or dissection identified in the abdominal or pelvic vasculature. No lymphadenopathy noted in the abdomen or pelvis.  Reproductive: Prostate gland and seminal vesicles are unremarkable in appearance.  Other: No significant volume of ascites. No pneumoperitoneum.  Musculoskeletal: There are no aggressive appearing lytic or blastic lesions noted in the visualized portions of the skeleton.  IMPRESSION: 1. No findings to account for the patient's history of hematuria. Specifically, no urinary tract calculi and no findings of urinary tract obstruction are noted at this time. 2. There are sub cm low-attenuation lesions in the kidneys bilaterally, too small to definitively characterize, but likely tiny cysts. 3. Normal appendix.   Electronically Signed By: Vinnie Langton M.D. On:  12/25/2014 09:08   Results for orders placed or performed in visit on 07/09/19  POCT Urinalysis Dipstick  Result Value Ref Range   Color, UA dark amber    Clarity, UA clear    Glucose, UA Negative Negative   Bilirubin, UA Negative    Ketones, UA Negative    Spec Grav, UA 1.010 1.010 - 1.025   Blood, UA trace    pH, UA 8.0 5.0 - 8.0   Protein, UA Negative Negative   Urobilinogen, UA 0.2 0.2 or 1.0 E.U./dL   Nitrite, UA Negative    Leukocytes, UA Negative Negative   Appearance     Odor        Assessment & Plan:   Problem List Items Addressed This Visit    None    Visit Diagnoses    Abnormal penile discharge    -  Primary   Relevant Orders   POCT Urinalysis Dipstick (Completed)   Urine Culture   Ambulatory referral to Urology   GC/Chlamydia probe amp ([place])not at [facility]      Clinically with episodic very brief episode of thicker penile discharge, non bloody, seems to  be episodic recurrent problem, this last flare up was worst (largest amount) but he has had repeat episodes from time to time. - He denies any other factor that provoked it, seemed to occur randomly - prior history Gross Hematuria work up in 2016 at [facility] with CT and cystoscopy was negative, reviewed in chart, did not follow up further since then  Reassurance today, without symptoms of UTI, or yeast infection, or hematuria  Plan Check UA Dipstick, showed trace RBC otherwise negative. Check urine culture to rule out infection Check Urine ancillary for GC/Chlamydia send out, pending result Given lack of symptoms today, seems to have resolved, no classic symptoms of GC/Chlamydia will defer empiric antibiotic therapy, and prior tests negative, no high risk exposure - we agree defer therapy for now, will treat only if positive test.  Proceed with referral to Urology again to return now 5 years later for consultation on this issue, seems different than prior gross hematuria episodes, this is more discharge, will  request assistance in diagnosing managing this issue.  **ADDENDUM** Patient sent mychart message after visit, he asked about Trichomoniasis. I advised this is uncommon to be symptomatic in males, but it is possible. Discussed this as a possibility, and agree it is worth investigating, I will add a Trichomonas urine ancillary cytology test and send out and send rx Metronidazole 500 twice a day for 7 days, no alcohol, sent rx, f/u with urology still as planned. See mychart msg  Orders Placed This Encounter  Procedures  . Urine Culture  . Ambulatory referral to Urology    Referral Priority:   Routine    Referral Type:   Consultation    Referral Reason:   Specialty Services Required    Requested Specialty:   Urology    Number of Visits Requested:   1  . POCT Urinalysis Dipstick    No orders of the defined types were placed in this encounter.    Follow up plan: Return if symptoms worsen or fail to improve, for penile discharge.   Saralyn Pilar, DO [facility] 07/09/2019, 3:25 PM

## 2019-07-11 LAB — URINE CULTURE
MICRO NUMBER:: 10437237
Result:: NO GROWTH
SPECIMEN QUALITY:: ADEQUATE

## 2019-07-12 LAB — URINE CYTOLOGY ANCILLARY ONLY
Chlamydia: NEGATIVE
Comment: NEGATIVE
Comment: NEGATIVE
Comment: NORMAL
Neisseria Gonorrhea: NEGATIVE
Trichomonas: NEGATIVE

## 2019-07-22 ENCOUNTER — Other Ambulatory Visit: Payer: Self-pay | Admitting: *Deleted

## 2019-07-22 DIAGNOSIS — R369 Urethral discharge, unspecified: Secondary | ICD-10-CM

## 2019-07-23 ENCOUNTER — Ambulatory Visit: Payer: 59 | Admitting: Urology

## 2020-01-17 ENCOUNTER — Ambulatory Visit
Admission: RE | Admit: 2020-01-17 | Discharge: 2020-01-17 | Disposition: A | Discharge status: Home / Self Care | Payer: 59 | Source: Ambulatory Visit | Attending: Family Medicine | Admitting: Family Medicine

## 2020-01-17 ENCOUNTER — Ambulatory Visit
Admission: RE | Admit: 2020-01-17 | Discharge: 2020-01-17 | Disposition: A | Discharge status: Home / Self Care | Payer: 59 | Attending: Family Medicine | Admitting: Family Medicine

## 2020-01-17 ENCOUNTER — Encounter: Payer: Self-pay | Admitting: Family Medicine

## 2020-01-17 ENCOUNTER — Ambulatory Visit (INDEPENDENT_AMBULATORY_CARE_PROVIDER_SITE_OTHER): Payer: 59 | Admitting: Family Medicine

## 2020-01-17 ENCOUNTER — Other Ambulatory Visit: Payer: Self-pay

## 2020-01-17 VITALS — BP 100/57 | HR 84 | Temp 97.8°F | Resp 16 | Ht 68.0 in | Wt 124.0 lb

## 2020-01-17 DIAGNOSIS — R1032 Left lower quadrant pain: Secondary | ICD-10-CM | POA: Insufficient documentation

## 2020-01-17 DIAGNOSIS — R195 Other fecal abnormalities: Secondary | ICD-10-CM | POA: Diagnosis not present

## 2020-01-17 DIAGNOSIS — R112 Nausea with vomiting, unspecified: Secondary | ICD-10-CM

## 2020-01-17 MED ORDER — DICYCLOMINE HCL 10 MG PO CAPS: 10.0000 mg | ORAL_CAPSULE | Freq: Three times a day (TID) | ORAL | 0 refills | Status: DC

## 2020-01-17 MED ORDER — ONDANSETRON 4 MG PO TBDP: 4.0000 mg | ORAL_TABLET | Freq: Three times a day (TID) | ORAL | 0 refills | Status: DC | PRN

## 2020-01-17 NOTE — Patient Instructions (Addendum)
Thank you for coming to the office today.  Start Zofran as needed dissolving pill for nausea as needed up to 3 times a day or every 8 hours.  Goal to resume some more regular diet if possible.  Dicyclomine (Bentyl) is abdominal pain and cramping medicine take with meals or after meal.   X-ray abdomen  [facility] Address: [address], [place], [place] [address] Phone: [phone]  IF it shows constipation or stool burden  You can do a reset of bowels to help resolve pain / and symptoms.  For Constipation (less frequent bowel movement that can be hard dry or involve straining).  Recommend trying OTC Miralax 17g = 1 capful in large glass water once daily for now, try several days to see if working, goal is soft stool or BM 1-2 times daily, if too loose then reduce dose or try every other day. If not effective may need to increase it to 2 doses at once in AM or may do 1 in morning and 1 in afternoon/evening  - This medicine is very safe and can be used often without any problem and will not make you dehydrated. It is good for use on AS NEEDED BASIS or even MAINTENANCE therapy for longer term for several days to weeks at a time to help regulate bowel movements  Other more natural remedies or preventative treatment: - Increase hydration with water - Increase fiber in diet (high fiber foods = vegetables, leafy greens, oats/grains) - May take OTC Fiber supplement (metamucil powder or pill/gummy) - May try OTC Probiotic --------------------------------------------------  If severe or worsening abdominal pain and cannot keep any food or liquid down, worry about dehydration, get checked out sooner hospital ED or Urgent Care   Please schedule a Follow-up Appointment to: Return in about 2 weeks (around 01/31/2020), or if symptoms worsen or fail to improve, for if unresolved abdominal pain.  If you have any other questions or concerns, please feel  free to call the office or send a message through MyChart. You may also schedule an earlier appointment if necessary.  Additionally, you may be receiving a survey about your experience at our office within a few days to 1 week by e-mail or mail. We value your feedback.  Saralyn Pilar, DO [facility] [place]

## 2020-01-17 NOTE — Progress Notes (Signed)
Subjective:    Patient ID: Thomas Burke, male    DOB: 02-01-1986, 34 y.o.   MRN: [id number]  Thomas Burke is a 34 y.o. male presenting on 01/17/2020 for Abdominal Pain (LLQ --onset two weeks ), Diarrhea (2 weeks), and Emesis (as per pt every time he eats and lost of appetite now--2 weeks)   HPI   LLQ Abdominal Pain / Diarrhea loose stool / nausea vomiting  Reports new onset problem in past 2 weeks, left lower abdominal discomfort and and pain symptoms with associated nausea vomiting, worse after meals (any type of food, not just trigger foods), liquids do not seem to worsen symptoms, occasional associated diarrhea loose watery stool. - Admits reduced appetite.  No prior similar history of this problem. No prior abdominal surgeries History of GERD in past. Tried OTC Pepto-Bismal occasional temporary relief, but does not resolve the problem. - Denies any blood in stool or vomit, dark stool  Past Surgical History:  Procedure Laterality Date  . none      Health Maintenance: Not vaccinated against Flu or COVID  Depression screen [facility] 2/9 01/17/2020 07/09/2019  Decreased Interest 0 0  Down, Depressed, Hopeless 0 0  PHQ - 2 Score 0 0    Social History   Tobacco Use  . Smoking status: Current Every Day Smoker    Packs/day: 1.50    Years: 12.00    Pack years: 18.00    Types: Cigarettes  . Smokeless tobacco: Never Used  Vaping Use  . Vaping Use: Never used  Substance Use Topics  . Alcohol use: Yes    Alcohol/week: 0.0 standard drinks  . Drug use: No    Review of Systems Per HPI unless specifically indicated above     Objective:    BP (!) 100/57   Pulse 84   Temp 97.8 F (36.6 C) (Temporal)   Resp 16   Ht 5\' 8"  (1.727 m)   Wt 124 lb (56.2 kg)   SpO2 97%   BMI 18.85 kg/m   Wt Readings from Last 3 Encounters:  01/17/20 124 lb (56.2 kg)  07/09/19 132 lb (59.9 kg)  08/03/18 125 lb (56.7 kg)    Physical Exam Vitals and nursing note reviewed.    Constitutional:      General: He is not in acute distress.    Appearance: He is well-developed. He is not diaphoretic.     Comments: Well-appearing, comfortable, cooperative  HENT:     Head: Normocephalic and atraumatic.  Eyes:     General:        Right eye: No discharge.        Left eye: No discharge.     Conjunctiva/sclera: Conjunctivae normal.  Cardiovascular:     Rate and Rhythm: Normal rate.  Pulmonary:     Effort: Pulmonary effort is normal.  Abdominal:     General: Bowel sounds are decreased. There is no distension.     Palpations: Abdomen is soft. There is no hepatomegaly, splenomegaly or mass.     Tenderness: There is abdominal tenderness in the left lower quadrant. There is no right CVA tenderness, left CVA tenderness, guarding or rebound. Negative signs include Murphy's sign, Rovsing's sign and McBurney's sign.  Skin:    General: Skin is warm and dry.     Findings: No erythema or rash.  Neurological:     Mental Status: He is alert and oriented to person, place, and time.  Psychiatric:  Behavior: Behavior normal.     Comments: Well groomed, good eye contact, normal speech and thoughts     I have personally reviewed the radiology report from 11.12.21 on [facility]  CLINICAL DATA:  34 year old male with 2 weeks of left lower abdominal pain, watery stool. Query constipation.  EXAM: ABDOMEN - 1 VIEW  COMPARISON:  CT Abdomen and Pelvis 12/25/2014.  FINDINGS: Two AP supine views at 1518 hours. Non obstructed bowel gas pattern. Average to below average retained stool in the large bowel. There is a miliary pattern of punctate radiodensities throughout the large bowel, perhaps related to recent bismuth containing medicinal ingestion (e.g. Pepto-Bismol). No definite pneumoperitoneum on these supine views. Compared to 2016 the abdominal and pelvic visceral contours are stable and within normal limits. Negative lung bases. Chronic levoconvex lumbar scoliosis. No  acute osseous abnormality identified.  IMPRESSION: Non obstructed bowel gas pattern with average volume of retained stool.   Electronically Signed   By: Odessa Fleming M.D.   On: 01/17/2020 15:36.    Results for orders placed or performed in visit on 07/09/19  Urine Culture   Specimen: Urine  Result Value Ref Range   MICRO NUMBER: [id number]    SPECIMEN QUALITY: Adequate    Sample Source URINE    STATUS: FINAL    Result: No Growth   POCT Urinalysis Dipstick  Result Value Ref Range   Color, UA dark amber    Clarity, UA clear    Glucose, UA Negative Negative   Bilirubin, UA Negative    Ketones, UA Negative    Spec Grav, UA 1.010 1.010 - 1.025   Blood, UA trace    pH, UA 8.0 5.0 - 8.0   Protein, UA Negative Negative   Urobilinogen, UA 0.2 0.2 or 1.0 E.U./dL   Nitrite, UA Negative    Leukocytes, UA Negative Negative   Appearance     Odor    Urine cytology ancillary only  Result Value Ref Range   Neisseria Gonorrhea Negative    Chlamydia Negative    Trichomonas Negative    Comment Normal Reference Ranger Chlamydia - Negative    Comment      Normal Reference Range Neisseria Gonorrhea - Negative   Comment Normal Reference Range Trichomonas - Negative       Assessment & Plan:   Problem List Items Addressed This Visit    None    Visit Diagnoses    LLQ abdominal pain    -  Primary   Relevant Medications   ondansetron (ZOFRAN ODT) 4 MG disintegrating tablet   dicyclomine (BENTYL) 10 MG capsule   Other Relevant Orders   DG Abd 1 View   Non-intractable vomiting with nausea, unspecified vomiting type       Relevant Medications   ondansetron (ZOFRAN ODT) 4 MG disintegrating tablet   Watery stools          Clinically with acute left lower abdominal pain, nausea vomiting loose stool History suggestive of possible viral syndrome vs constipation with some encopresis with the looser stool. Abdomen is benign without acute concern on exam  Check STAT KUB for  constipation  Start Zofran ODT nausea vomiting  Start Dicyclomine for symptoms seems mostly cramping abdominal pain assoc with eating  Gradual improve diet  *Called w/ X-ray - negative for acute concerns, no constipation, hold miralax since no impaction  Follow-up within 1 week if unresolved, consider further labs / imaging / refer  Orders Placed This Encounter  Procedures  .  DG Abd 1 View    Standing Status:   Future    Standing Expiration Date:   07/16/2020    Order Specific Question:   Reason for Exam (SYMPTOM  OR DIAGNOSIS REQUIRED)    Answer:   2 week acute left lower abdominal pain, watery stool but no regular BM, nv, rule out constipation    Order Specific Question:   Preferred imaging location?    Answer:   [facility]     Meds ordered this encounter  Medications  . ondansetron (ZOFRAN ODT) 4 MG disintegrating tablet    Sig: Take 1 tablet (4 mg total) by mouth every 8 (eight) hours as needed for nausea or vomiting.    Dispense:  30 tablet    Refill:  0  . dicyclomine (BENTYL) 10 MG capsule    Sig: Take 1 capsule (10 mg total) by mouth 4 (four) times daily -  before meals and at bedtime. As needed for cramping abdominal pain    Dispense:  30 capsule    Refill:  0      Follow up plan: Return in about 2 weeks (around 01/31/2020), or if symptoms worsen or fail to improve, for if unresolved abdominal pain.   Saralyn Pilar, DO [facility] 01/17/2020, 2:50 PM

## 2020-04-21 ENCOUNTER — Ambulatory Visit: Payer: 59 | Admitting: Family Medicine

## 2020-04-21 ENCOUNTER — Encounter: Payer: Self-pay | Admitting: Family Medicine

## 2020-04-21 ENCOUNTER — Telehealth: Payer: 59 | Admitting: Family Medicine

## 2020-04-21 DIAGNOSIS — F322 Major depressive disorder, single episode, severe without psychotic features: Secondary | ICD-10-CM | POA: Diagnosis not present

## 2020-04-21 DIAGNOSIS — R45851 Suicidal ideations: Secondary | ICD-10-CM | POA: Diagnosis not present

## 2020-04-21 NOTE — Progress Notes (Signed)
[name], hardge are scheduled for a virtual visit with your provider today.    Just as we do with appointments in the office, we must obtain your consent to participate.  Your consent will be active for this visit and any virtual visit you may have with one of our providers in the next 365 days.    If you have a MyChart account, I can also send a copy of this consent to you electronically.  All virtual visits are billed to your insurance company just like a traditional visit in the office.  As this is a virtual visit, video technology does not allow for your provider to perform a traditional examination.  This may limit your provider's ability to fully assess your condition.  If your provider identifies any concerns that need to be evaluated in person or the need to arrange testing such as labs, EKG, etc, we will make arrangements to do so.    Although advances in technology are sophisticated, we cannot ensure that it will always work on either your end or our end.  If the connection with a video visit is poor, we may have to switch to a telephone visit.  With either a video or telephone visit, we are not always able to ensure that we have a secure connection.   I need to obtain your verbal consent now.   Are you willing to proceed with your visit today?   Thomas Burke has provided verbal consent on 04/21/2020 for a virtual visit (video or telephone).   Freddy Finner, NP 04/21/2020  4:12 PM   Date:  04/21/2020   ID:  Thomas Burke, DOB [birth year], MRN [id number]  Patient Location: Home Provider Location: Home Office   Participants: Patient and Provider for Visit and Wrap up  Method of visit: Phone  Location of Patient: Home Location of Provider: Home Office Consent was obtain for visit over the phone Services rendered by provider: Visit was performed via phone I verified that I am speaking with the correct person using two identifiers.  PCP:  Smitty Cords,  DO   Chief Complaint:  Depression is bad   History of Present Illness:    Thomas Burke is a 35 y.o. male with history as stated below. Presents video telehealth for an acute care visit secondary to depression. Reports "shit is getting bad"  Reports taking too many sleeping pills the other night.  He reports divorce and work stress are increased.  Reports calling his PCP to get an appt- which was suppose to be today at 3pm. He did not realize it was suppose to be today so he missed it. He reports wanting help. In questioning for PHQ he score is 25 and he is + for suicide thoughts. Visit was stopped and he was encouraged to go to the ED. He did not want to go, but was willing to go to his PCP office.   Past Medical, Surgical, Social History, Allergies, and Medications have been Reviewed.  Past Medical History:  Diagnosis Date  . Depression   . Migraine   . Persistent headaches   . Seasonal allergies     No outpatient medications have been marked as taking for the 04/21/20 encounter (Video Visit) with Freddy Finner, NP.     Allergies:   Patient has no known allergies.   ROS See HPI for history of present illness.  Physical Exam  Visit changed to phone due to connection issues.  Depression screen [facility] 2/9 04/21/2020 01/17/2020 07/09/2019  Decreased Interest 3 0 0  Down, Depressed, Hopeless 3 0 0  PHQ - 2 Score 6 0 0  Altered sleeping 3 - -  Tired, decreased energy 1 - -  Change in appetite 3 - -  Feeling bad or failure about yourself  3 - -  Trouble concentrating 3 - -  Moving slowly or fidgety/restless 3 - -  Suicidal thoughts 3 - -  PHQ-9 Score 25 - -  Difficult doing work/chores Extremely dIfficult - -     1. Depression, major, single episode, severe (HCC) -PHQ 25 -reports suicide thoughts and intent, advised ED refused was willing to go to his PCP office now to speak to someone in person -risk of OD as he reports taking several sleeping pills at one  time -does want help- encouraged to talk to PCP about this I personally reached out to his PCP to help make them aware.  PCP and office staff are working to get him the care he needs  2. Suicidal thoughts -PHQ 25 -reports suicide thoughts and intent, advised ED refused was willing to go to his PCP office now to speak to someone in person -risk of OD as he reports taking several sleeping pills at one time -does want help- encouraged to talk to PCP about this    Time:   Today, I have spent 20 minutes with the patient with telehealth technology discussing the above problems, reviewing the chart, previous notes, medications and orders.    Tests Ordered: No orders of the defined types were placed in this encounter.   Medication Changes: No orders of the defined types were placed in this encounter.    Disposition:  Follow up w PCP now- refused ED  Signed, Freddy Finner, NP  04/21/2020 4:12 PM

## 2020-04-22 ENCOUNTER — Other Ambulatory Visit: Payer: Self-pay

## 2020-04-22 ENCOUNTER — Emergency Department: Payer: 59

## 2020-04-22 ENCOUNTER — Ambulatory Visit: Payer: 59 | Admitting: Family Medicine

## 2020-04-22 ENCOUNTER — Encounter: Payer: Self-pay | Admitting: Psychiatry

## 2020-04-22 ENCOUNTER — Inpatient Hospital Stay
Admission: RE | Admit: 2020-04-22 | Discharge: 2020-04-24 | DRG: 885 | Disposition: A | Discharge status: Home / Self Care | Payer: 59 | Source: Intra-hospital | Attending: Behavioral Health | Admitting: Behavioral Health

## 2020-04-22 ENCOUNTER — Emergency Department
Admission: EM | Admit: 2020-04-22 | Discharge: 2020-04-22 | Disposition: A | Discharge status: Other Acute Inpatient Hospital | Payer: 59 | Attending: Emergency Medicine | Admitting: Emergency Medicine

## 2020-04-22 DIAGNOSIS — R519 Headache, unspecified: Secondary | ICD-10-CM | POA: Diagnosis not present

## 2020-04-22 DIAGNOSIS — F23 Brief psychotic disorder: Secondary | ICD-10-CM

## 2020-04-22 DIAGNOSIS — G43909 Migraine, unspecified, not intractable, without status migrainosus: Secondary | ICD-10-CM | POA: Diagnosis present

## 2020-04-22 DIAGNOSIS — Z046 Encounter for general psychiatric examination, requested by authority: Secondary | ICD-10-CM | POA: Diagnosis not present

## 2020-04-22 DIAGNOSIS — Z9151 Personal history of suicidal behavior: Secondary | ICD-10-CM | POA: Insufficient documentation

## 2020-04-22 DIAGNOSIS — Z7982 Long term (current) use of aspirin: Secondary | ICD-10-CM | POA: Insufficient documentation

## 2020-04-22 DIAGNOSIS — Z79899 Other long term (current) drug therapy: Secondary | ICD-10-CM | POA: Diagnosis not present

## 2020-04-22 DIAGNOSIS — Z818 Family history of other mental and behavioral disorders: Secondary | ICD-10-CM | POA: Insufficient documentation

## 2020-04-22 DIAGNOSIS — F322 Major depressive disorder, single episode, severe without psychotic features: Principal | ICD-10-CM | POA: Diagnosis present

## 2020-04-22 DIAGNOSIS — Z20822 Contact with and (suspected) exposure to covid-19: Secondary | ICD-10-CM | POA: Insufficient documentation

## 2020-04-22 DIAGNOSIS — Z5900 Homelessness unspecified: Secondary | ICD-10-CM | POA: Diagnosis not present

## 2020-04-22 DIAGNOSIS — F17210 Nicotine dependence, cigarettes, uncomplicated: Secondary | ICD-10-CM | POA: Diagnosis present

## 2020-04-22 DIAGNOSIS — R45851 Suicidal ideations: Secondary | ICD-10-CM

## 2020-04-22 DIAGNOSIS — D72829 Elevated white blood cell count, unspecified: Secondary | ICD-10-CM | POA: Diagnosis not present

## 2020-04-22 DIAGNOSIS — J302 Other seasonal allergic rhinitis: Secondary | ICD-10-CM | POA: Diagnosis present

## 2020-04-22 DIAGNOSIS — F323 Major depressive disorder, single episode, severe with psychotic features: Secondary | ICD-10-CM | POA: Diagnosis present

## 2020-04-22 LAB — CBC
HCT: 41.2 % (ref 39.0–52.0)
Hemoglobin: 15.3 g/dL (ref 13.0–17.0)
MCH: 32.4 pg (ref 26.0–34.0)
MCHC: 37.1 g/dL — ABNORMAL HIGH (ref 30.0–36.0)
MCV: 87.3 fL (ref 80.0–100.0)
Platelets: 338 10*3/uL (ref 150–400)
RBC: 4.72 MIL/uL (ref 4.22–5.81)
RDW: 12.1 % (ref 11.5–15.5)
WBC: 22.9 10*3/uL — ABNORMAL HIGH (ref 4.0–10.5)
nRBC: 0 % (ref 0.0–0.2)

## 2020-04-22 LAB — URINALYSIS, COMPLETE (UACMP) WITH MICROSCOPIC
Bacteria, UA: NONE SEEN
Bilirubin Urine: NEGATIVE
Glucose, UA: NEGATIVE mg/dL
Hgb urine dipstick: NEGATIVE
Ketones, ur: NEGATIVE mg/dL
Leukocytes,Ua: NEGATIVE
Nitrite: NEGATIVE
Protein, ur: NEGATIVE mg/dL
Specific Gravity, Urine: 1.003 — ABNORMAL LOW (ref 1.005–1.030)
Squamous Epithelial / HPF: NONE SEEN (ref 0–5)
pH: 6 (ref 5.0–8.0)

## 2020-04-22 LAB — CBC WITH DIFFERENTIAL/PLATELET
Abs Immature Granulocytes: 0.11 10*3/uL — ABNORMAL HIGH (ref 0.00–0.07)
Abs Immature Granulocytes: 0.03 10*3/uL (ref 0.00–0.07)
Basophils Absolute: 0.1 10*3/uL (ref 0.0–0.1)
Basophils Absolute: 0.1 10*3/uL (ref 0.0–0.1)
Basophils Relative: 1 %
Basophils Relative: 1 %
Eosinophils Absolute: 0 10*3/uL (ref 0.0–0.5)
Eosinophils Absolute: 0.2 10*3/uL (ref 0.0–0.5)
Eosinophils Relative: 0 %
Eosinophils Relative: 2 %
HCT: 42.3 % (ref 39.0–52.0)
HCT: 43.7 % (ref 39.0–52.0)
Hemoglobin: 15 g/dL (ref 13.0–17.0)
Hemoglobin: 15.6 g/dL (ref 13.0–17.0)
Immature Granulocytes: 1 %
Immature Granulocytes: 0 %
Lymphocytes Relative: 15 %
Lymphocytes Relative: 26 %
Lymphs Abs: 3.4 10*3/uL (ref 0.7–4.0)
Lymphs Abs: 2.4 10*3/uL (ref 0.7–4.0)
MCH: 32.1 pg (ref 26.0–34.0)
MCH: 31.8 pg (ref 26.0–34.0)
MCHC: 35.5 g/dL (ref 30.0–36.0)
MCHC: 35.7 g/dL (ref 30.0–36.0)
MCV: 90.6 fL (ref 80.0–100.0)
MCV: 89 fL (ref 80.0–100.0)
Monocytes Absolute: 1.5 10*3/uL — ABNORMAL HIGH (ref 0.1–1.0)
Monocytes Absolute: 0.9 10*3/uL (ref 0.1–1.0)
Monocytes Relative: 6 %
Monocytes Relative: 10 %
Neutro Abs: 17.9 10*3/uL — ABNORMAL HIGH (ref 1.7–7.7)
Neutro Abs: 5.5 10*3/uL (ref 1.7–7.7)
Neutrophils Relative %: 77 %
Neutrophils Relative %: 61 %
Platelets: 355 10*3/uL (ref 150–400)
Platelets: 333 10*3/uL (ref 150–400)
RBC: 4.67 MIL/uL (ref 4.22–5.81)
RBC: 4.91 MIL/uL (ref 4.22–5.81)
RDW: 12.5 % (ref 11.5–15.5)
RDW: 12.1 % (ref 11.5–15.5)
WBC: 23 10*3/uL — ABNORMAL HIGH (ref 4.0–10.5)
WBC: 9 10*3/uL (ref 4.0–10.5)
nRBC: 0 % (ref 0.0–0.2)
nRBC: 0 % (ref 0.0–0.2)

## 2020-04-22 LAB — LIPID PANEL
Cholesterol: 135 mg/dL (ref 0–200)
Cholesterol: 144 mg/dL (ref 0–200)
HDL: 45 mg/dL (ref >40)
HDL: 45 mg/dL (ref >40)
LDL Cholesterol: 81 mg/dL (ref 0–99)
LDL Cholesterol: 84 mg/dL (ref 0–99)
Total CHOL/HDL Ratio: 3 RATIO
Total CHOL/HDL Ratio: 3.2 RATIO
Triglycerides: 44 mg/dL (ref <150)
Triglycerides: 75 mg/dL (ref <150)
VLDL: 9 mg/dL (ref 0–40)
VLDL: 15 mg/dL (ref 0–40)

## 2020-04-22 LAB — RESP PANEL BY RT-PCR (FLU A&B, COVID) ARPGX2
Influenza A by PCR: NEGATIVE
Influenza B by PCR: NEGATIVE
SARS Coronavirus 2 by RT PCR: NEGATIVE

## 2020-04-22 LAB — URINE DRUG SCREEN, QUALITATIVE (ARMC ONLY)
Amphetamines, Ur Screen: NOT DETECTED
Barbiturates, Ur Screen: NOT DETECTED
Benzodiazepine, Ur Scrn: NOT DETECTED
Cannabinoid 50 Ng, Ur ~~LOC~~: NOT DETECTED
Cocaine Metabolite,Ur ~~LOC~~: NOT DETECTED
MDMA (Ecstasy)Ur Screen: NOT DETECTED
Methadone Scn, Ur: NOT DETECTED
Opiate, Ur Screen: NOT DETECTED
Phencyclidine (PCP) Ur S: NOT DETECTED
Tricyclic, Ur Screen: NOT DETECTED

## 2020-04-22 LAB — COMPREHENSIVE METABOLIC PANEL
ALT: 9 U/L (ref 0–44)
AST: 16 U/L (ref 15–41)
Albumin: 4.5 g/dL (ref 3.5–5.0)
Alkaline Phosphatase: 52 U/L (ref 38–126)
Anion gap: 14 (ref 5–15)
BUN: <5 mg/dL — ABNORMAL LOW (ref 6–20)
CO2: 23 mmol/L (ref 22–32)
Calcium: 9.2 mg/dL (ref 8.9–10.3)
Chloride: 99 mmol/L (ref 98–111)
Creatinine, Ser: 0.9 mg/dL (ref 0.61–1.24)
GFR, Estimated: >60 mL/min (ref >60)
Glucose, Bld: 94 mg/dL (ref 70–99)
Potassium: 3.3 mmol/L — ABNORMAL LOW (ref 3.5–5.1)
Sodium: 136 mmol/L (ref 135–145)
Total Bilirubin: 0.6 mg/dL (ref 0.3–1.2)
Total Protein: 7.4 g/dL (ref 6.5–8.1)

## 2020-04-22 LAB — ACETAMINOPHEN LEVEL: Acetaminophen (Tylenol), Serum: <10 ug/mL — ABNORMAL LOW (ref 10–30)

## 2020-04-22 LAB — PROCALCITONIN: Procalcitonin: <0.1 ng/mL

## 2020-04-22 LAB — ETHANOL: Alcohol, Ethyl (B): <10 mg/dL (ref <10)

## 2020-04-22 LAB — HEMOGLOBIN A1C
Hgb A1c MFr Bld: 5.4 % (ref 4.8–5.6)
Mean Plasma Glucose: 108.28 mg/dL

## 2020-04-22 LAB — SALICYLATE LEVEL: Salicylate Lvl: 20.2 mg/dL (ref 7.0–30.0)

## 2020-04-22 MED ORDER — HYDROXYZINE HCL 50 MG PO TABS
50.0000 mg | ORAL_TABLET | Freq: Four times a day (QID) | ORAL | Status: DC | PRN
  Administered 2020-04-22 – 2020-04-24 (×4): 50 mg via ORAL
  Filled 2020-04-22 (×4): qty 1

## 2020-04-22 MED ORDER — ACETAMINOPHEN 325 MG PO TABS
650.0000 mg | ORAL_TABLET | Freq: Four times a day (QID) | ORAL | Status: DC | PRN
  Administered 2020-04-24: 650 mg via ORAL
  Filled 2020-04-22: qty 2

## 2020-04-22 MED ORDER — HALOPERIDOL 5 MG PO TABS
5.0000 mg | ORAL_TABLET | Freq: Once | ORAL | Status: AC
  Administered 2020-04-22: 5 mg via ORAL
  Filled 2020-04-22: qty 1

## 2020-04-22 MED ORDER — ALUM & MAG HYDROXIDE-SIMETH 200-200-20 MG/5ML PO SUSP: 30.0000 mL | ORAL | Status: DC | PRN

## 2020-04-22 MED ORDER — NICOTINE 21 MG/24HR TD PT24
21.0000 mg | MEDICATED_PATCH | Freq: Once | TRANSDERMAL | Status: DC
  Administered 2020-04-22: 21 mg via TRANSDERMAL
  Filled 2020-04-22: qty 1

## 2020-04-22 MED ORDER — HYDROXYZINE HCL 25 MG PO TABS: 50.0000 mg | ORAL_TABLET | Freq: Four times a day (QID) | ORAL | Status: DC | PRN

## 2020-04-22 MED ORDER — LORAZEPAM 2 MG PO TABS
2.0000 mg | ORAL_TABLET | Freq: Once | ORAL | Status: AC
  Administered 2020-04-22: 2 mg via ORAL
  Filled 2020-04-22: qty 1

## 2020-04-22 MED ORDER — MAGNESIUM HYDROXIDE 400 MG/5ML PO SUSP: 30.0000 mL | Freq: Every day | ORAL | Status: DC | PRN

## 2020-04-22 NOTE — ED Triage Notes (Signed)
Pt to ED POV for SI. States he is "having a breakdown" and has tried to kill himself multiple times including throwing himself in front of a truck.  Pt with pressured speech, fidgeting in triage.  Appears paranoid.  States he keeps smelling something burning everywhere he goes

## 2020-04-22 NOTE — ED Notes (Addendum)
Pt still pacing back and forth in room.  This RN went to assess if pt would be willing to give blood work. Pt hesitant and refused stating, "is there something else I can get for my nerves." ED provider aware.   RN deferred getting bloodwork at this time.

## 2020-04-22 NOTE — ED Notes (Signed)
Pt was dressed out by this tech and security.   Pt had black shirt Plaid underwear Grey jeans with belt White socks Black shoes [employer]

## 2020-04-22 NOTE — ED Provider Notes (Signed)
[facility] Emergency Department Provider Note  ____________________________________________   Event Date/Time   First MD Initiated Contact with Patient 04/22/20 [phone]     (approximate)  I have reviewed the triage vital signs and the nursing notes.   HISTORY  Chief Complaint Suicidal    HPI Thomas Burke is a 35 y.o. male with history of depression, headaches who presents to the emergency department with suicidal thoughts today.  States that he plans to throw himself in front of a truck or drive into another vehicle.  Denies HI.  Reports occasional alcohol use but no drug use.  Stating in triage that he smelled something burning.  Becomes agitated over the smell of hand sanitizer when I entered the room.  Very fidgety and looking around it appears paranoid.  States he has had previous suicide attempt as a teenager.        Past Medical History:  Diagnosis Date  . Depression   . Migraine   . Persistent headaches   . Seasonal allergies     Patient Active Problem List   Diagnosis Date Noted  . Suicidal thoughts 04/21/2020  . Depression, major, single episode, severe (HCC) 04/21/2020  . Hematuria 11/13/2014    Past Surgical History:  Procedure Laterality Date  . none      Prior to Admission medications   Medication Sig Start Date End Date Taking? Authorizing Provider  aspirin-acetaminophen-caffeine (EXCEDRIN MIGRAINE) [phone] MG tablet Take by mouth every 6 (six) hours as needed for headache.    [provider]  dicyclomine (BENTYL) 10 MG capsule Take 1 capsule (10 mg total) by mouth 4 (four) times daily -  before meals and at bedtime. As needed for cramping abdominal pain 01/17/20   Smitty Cords, DO  ibuprofen (ADVIL,MOTRIN) 200 MG tablet Take 200 mg by mouth every 6 (six) hours as needed.    [provider]  ondansetron (ZOFRAN ODT) 4 MG disintegrating tablet Take 1 tablet (4 mg total) by mouth every 8  (eight) hours as needed for nausea or vomiting. 01/17/20   Smitty Cords, DO    Allergies Patient has no known allergies.  Family History  Problem Relation Age of Onset  . Depression Mother     Social History Social History   Tobacco Use  . Smoking status: Current Every Day Smoker    Packs/day: 1.50    Years: 12.00    Pack years: 18.00    Types: Cigarettes  . Smokeless tobacco: Never Used  Vaping Use  . Vaping Use: Never used  Substance Use Topics  . Alcohol use: Yes    Alcohol/week: 0.0 standard drinks  . Drug use: No    Review of Systems Level V caveat secondary to psychiatric condition  ____________________________________________   PHYSICAL EXAM:  VITAL SIGNS: ED Triage Vitals  Enc Vitals Group     BP 04/22/20 0445 (!) 125/98     Pulse Rate 04/22/20 0445 (!) 109     Resp 04/22/20 0445 20     Temp 04/22/20 0445 98.9 F (37.2 C)     Temp Source 04/22/20 0445 Oral     SpO2 04/22/20 0445 98 %     Weight 04/22/20 0446 125 lb (56.7 kg)     Height 04/22/20 0446 5\' 8"  (1.727 m)     Head Circumference --      Peak Flow --      Pain Score 04/22/20 0446 0     Pain Loc --  Pain Edu? --      Excl. in GC? --    CONSTITUTIONAL: Alert and oriented and responds appropriately to questions. Well-appearing; well-nourished HEAD: Normocephalic EYES: Conjunctivae clear, pupils appear equal, EOM appear intact ENT: normal nose; moist mucous membranes NECK: Supple, normal ROM CARD: Regular and tachycardic; S1 and S2 appreciated; no murmurs, no clicks, no rubs, no gallops RESP: Normal chest excursion without splinting or tachypnea; breath sounds clear and equal bilaterally; no wheezes, no rhonchi, no rales, no hypoxia or respiratory distress, speaking full sentences ABD/GI: Normal bowel sounds; non-distended; soft, non-tender, no rebound, no guarding, no peritoneal signs, no hepatosplenomegaly BACK: The back appears normal EXT: Normal ROM in all joints; no  deformity noted, no edema; no cyanosis SKIN: Normal color for age and race; warm; no rash on exposed skin NEURO: Moves all extremities equally PSYCH: Agitated, fidgety, appears paranoid.  Poor eye contact.  Endorses SI with plan.  Denies HI.  Does not appear to be responding to internal stimuli.  ____________________________________________   LABS (all labs ordered are listed, but only abnormal results are displayed)  Labs Reviewed  RESP PANEL BY RT-PCR (FLU A&B, COVID) ARPGX2  COMPREHENSIVE METABOLIC PANEL  ETHANOL  SALICYLATE LEVEL  ACETAMINOPHEN LEVEL  CBC  URINE DRUG SCREEN, QUALITATIVE ([facility] ONLY)   ____________________________________________  EKG  none ____________________________________________  RADIOLOGY I, Kristen Ward, personally viewed and evaluated these images (plain radiographs) as part of my medical decision making, as well as reviewing the written report by the radiologist.  ED MD interpretation:  none  Official radiology report(s): No results found.  ____________________________________________   PROCEDURES  Procedure(s) performed (including Critical Care):  Procedures    ____________________________________________   INITIAL IMPRESSION / ASSESSMENT AND PLAN / ED COURSE  As part of my medical decision making, I reviewed the following data within the electronic MEDICAL RECORD NUMBER Nursing notes reviewed and incorporated, Interpreter needed, Old chart reviewed, Notes from prior ED visits and [place] Controlled Substance Database         Patient here with suicidal thoughts.  He is very agitated and appears paranoid.  Came in here on his own volition but then stating that he is refusing blood work and refusing to dress out and wants to leave.  I have placed patient under involuntary commitment given my concerns that he is suicidal and not able to contract for safety.  He reports he has a plan and has had a previous suicide attempt.  He is very agitated  but agrees to taking oral Ativan to help with his agitation.  Once he is more calm, will obtain labs, urine, Covid swab.  TTS and psychiatric consultation has been placed.     5:23 AM  Spoke with psych NP and TTS.  They recommend inpatient psych treatment and I agree.  They will look for placement.  6:21 AM  Pt still appears paranoid, agitated and refusing blood work.  Requesting something else for agitation.  No significant improvement after 2 mg of oral Ativan.  Will give 5 mg of oral Haldol.  I reviewed all nursing notes and pertinent previous records as available.  I have reviewed and interpreted any EKGs, lab and urine results, imaging (as available).  ____________________________________________   FINAL CLINICAL IMPRESSION(S) / ED DIAGNOSES  Final diagnoses:  Suicidal thoughts     ED Discharge Orders    None      *Please note:  Thomas Burke was evaluated in Emergency Department on 04/22/2020 for the symptoms described in  the history of present illness. He was evaluated in the context of the global COVID-19 pandemic, which necessitated consideration that the patient might be at risk for infection with the SARS-CoV-2 virus that causes COVID-19. Institutional protocols and algorithms that pertain to the evaluation of patients at risk for COVID-19 are in a state of rapid change based on information released by regulatory bodies including the CDC and federal and state organizations. These policies and algorithms were followed during the patient's care in the ED.  Some ED evaluations and interventions may be delayed as a result of limited staffing during and the pandemic.*   Note:  This document was prepared using Dragon voice recognition software and may include unintentional dictation errors.   Ward, Layla Maw, DO 04/22/20 [phone]

## 2020-04-22 NOTE — BH Assessment (Signed)
Comprehensive Clinical Assessment (CCA) Note  04/22/2020 Thomas Burke Thomas Burke [id number]  Chief Complaint: Patient is a 35 year old male presenting to [facility] ED initially voluntary but has since been IVC'd. Per triage note Pt to ED POV for SI. States he is "having a breakdown" and has tried to kill himself multiple times including throwing himself in front of a truck. Pt with pressured speech, fidgeting in triage. Appears paranoid. States he keeps smelling something burning everywhere he goes. During assessment patient appears alert and oriented x4, cooperative, anxious and restless, continues to be paranoid. Patient reports the reason he is presenting to the ED "I've been trying to off myself, it's 2022 why wouldn't anyone try and kill themselves." Patient reports that he is currently going through a divorce with his wife "we were together for 8-9 years" since the separation patient has not had a stable living situation "my kids are better off without me." Patient reports having multiple attempts recently "I tried to take 30 BC powder packs on Friday" patient also reports an attempt with Cocaine "that was a couple of months ago though." Patient denies using any drugs or alcohol last night or this morning. Patient continues to report SI and denies any plans "my plans keep changing." Patient continues to report SI, denies HI/AH/VH and does not appear to be responding to any internal or external stimuli.  Per Psyc NP Elenore PaddyJackie Thompson patient is recommended for Inpatient Hospitalization  Chief Complaint  Patient presents with  . Suicidal   Visit Diagnosis: Major Depressive Disorder, single episode, severe    CCA Screening, Triage and Referral (STR)  Patient Reported Information How did you hear about us? Self  Referral name: No data recorded Referral phone number: No data recorded  Whom do you see for routine medical problems? Other (Comment)  Practice/Facility Name: No data  recorded Practice/Facility Phone Number: No data recorded Name of Contact: No data recorded Contact Number: No data recorded Contact Fax Number: No data recorded Prescriber Name: No data recorded Prescriber Address (if known): No data recorded  What Is the Reason for Your Visit/Call Today? No data recorded How Long Has This Been Causing You Problems? > than 6 months  What Do You Feel Would Help You the Most Today? Assessment Only; Therapy; Medication   Have You Recently Been in Any Inpatient Treatment (Hospital/Detox/Crisis Center/28-Day Program)? No  Name/Location of Program/Hospital:No data recorded How Long Were You There? No data recorded When Were You Discharged? No data recorded  Have You Ever Received Services From [facility] Before? No  Who Do You See at [facility]? No data recorded  Have You Recently Had Any Thoughts About Hurting Yourself? Yes  Are You Planning to Commit Suicide/Harm Yourself At This time? Yes   Have you Recently Had Thoughts About Hurting Someone Karolee Ohslse? No  Explanation: No data recorded  Have You Used Any Alcohol or Drugs in the Past 24 Hours? No  How Long Ago Did You Use Drugs or Alcohol? No data recorded What Did You Use and How Much? No data recorded  Do You Currently Have a Therapist/Psychiatrist? No  Name of Therapist/Psychiatrist: No data recorded  Have You Been Recently Discharged From Any Office Practice or Programs? No  Explanation of Discharge From Practice/Program: No data recorded    CCA Screening Triage Referral Assessment Type of Contact: Face-to-Face  Is this Initial or Reassessment? No data recorded Date Telepsych consult ordered in [facility]:  No data recorded Time Telepsych consult ordered in [facility]:  No data recorded  Patient Reported Information Reviewed? Yes  Patient Left Without Being Seen? No data recorded Reason for Not Completing Assessment: No data recorded  Collateral Involvement: No data recorded  Does  Patient Have a Court Appointed Legal Guardian? No data recorded Name and Contact of Legal Guardian: No data recorded If Minor and Not Living with Parent(s), Who has Custody? No data recorded Is CPS involved or ever been involved? Never  Is APS involved or ever been involved? Never   Patient Determined To Be At Risk for Harm To Self or Others Based on Review of Patient Reported Information or Presenting Complaint? Yes, for Self-Harm  Method: No data recorded Availability of Means: No data recorded Intent: No data recorded Notification Required: No data recorded Additional Information for Danger to Others Potential: No data recorded Additional Comments for Danger to Others Potential: No data recorded Are There Guns or Other Weapons in Your Home? No data recorded Types of Guns/Weapons: No data recorded Are These Weapons Safely Secured?                            No data recorded Who Could Verify You Are Able To Have These Secured: No data recorded Do You Have any Outstanding Charges, Pending Court Dates, Parole/Probation? No data recorded Contacted To Inform of Risk of Harm To Self or Others: No data recorded  Location of Assessment: [facility] ED   Does Patient Present under Involuntary Commitment? Yes  IVC Papers Initial File Date: 04/22/2020   [place] of Residence: [place]   Patient Currently Receiving the Following Services: No data recorded  Determination of Need: Emergent (2 hours)   Options For Referral: No data recorded    CCA Biopsychosocial Intake/Chief Complaint:  Patient presents to the ED voluntarily for SI  Current Symptoms/Problems: Patient is currently reporting symptoms of depression that include SI with plans in the past   Patient Reported Schizophrenia/Schizoaffective Diagnosis in Past: No   Strengths: Patient is able to communicate effectively  Preferences: Unknown  Abilities: Patient is able to communicate effectively   Type of Services Patient  Feels are Needed: Unknown   Initial Clinical Notes/Concerns: None   Mental Health Symptoms Depression:  Change in energy/activity; Difficulty Concentrating; Hopelessness; Increase/decrease in appetite; Sleep (too much or little); Worthlessness   Duration of Depressive symptoms: Greater than two weeks   Mania:  Racing thoughts   Anxiety:   Sleep; Difficulty concentrating   Psychosis:  None   Duration of Psychotic symptoms: No data recorded  Trauma:  None   Obsessions:  None   Compulsions:  None   Inattention:  None   Hyperactivity/Impulsivity:  Fidgets with hands/feet   Oppositional/Defiant Behaviors:  None   Emotional Irregularity:  None   Other Mood/Personality Symptoms:  No data recorded   Mental Status Exam Appearance and self-care  Stature:  Average   Weight:  Average weight   Clothing:  Disheveled   Grooming:  Neglected   Cosmetic use:  None   Posture/gait:  Slumped   Motor activity:  Restless   Sensorium  Attention:  Normal   Concentration:  Anxiety interferes; Scattered   Orientation:  X5   Recall/memory:  Normal   Affect and Mood  Affect:  Anxious; Labile; Depressed   Mood:  Anxious; Depressed   Relating  Eye contact:  Fleeting   Facial expression:  Anxious   Attitude toward examiner:  Cooperative   Thought and Language  Speech flow:  Pressured; Other (Comment) (Rapid)   Thought content:  Appropriate to Mood and Circumstances   Preoccupation:  None   Hallucinations:  None   Organization:  No data recorded  [employer] of Knowledge:  Fair   Intelligence:  Average   Abstraction:  Normal   Judgement:  Poor   Reality Testing:  Adequate   Insight:  Poor; Lacking   Decision Making:  Impulsive   Social Functioning  Social Maturity:  Responsible   Social Judgement:  Heedless   Stress  Stressors:  Family conflict; Relationship; Financial; Housing   Coping Ability:  Deficient supports   Skill  Deficits:  None   Supports:  Support needed     Religion: Religion/Spirituality Are You A Religious Person?: No  Leisure/Recreation: Leisure / Recreation Do You Have Hobbies?: No  Exercise/Diet: Exercise/Diet Do You Exercise?: No Have You Gained or Lost A Significant Amount of Weight in the Past Six Months?: No Do You Follow a Special Diet?: No Do You Have Any Trouble Sleeping?: No   CCA Employment/Education Employment/Work Situation: Employment / Work Situation Employment situation: Employed Where is patient currently employed?: Unknown How long has patient been employed?: Unknown Patient's job has been impacted by current illness: No What is the longest time patient has a held a job?: Unknown Where was the patient employed at that time?: Unknown Has patient ever been in the [employer]?: No  Education: Education Is Patient Currently Attending School?: No Did Garment/textile technologist From [employer]?:  (Unknown) Did You Have An Individualized Education Program (IIEP): No Did You Have Any Difficulty At [employer]?: No Patient's Education Has Been Impacted by Current Illness: No   CCA Family/Childhood History Family and Relationship History: Family history Marital status: Separated Separated, when?: Unknown What types of issues is patient dealing with in the relationship?: Patient is currently going through a divorce with his wife Additional relationship information: None reported Are you sexually active?:  (Unknown) What is your sexual orientation?: Heterosexual Has your sexual activity been affected by drugs, alcohol, medication, or emotional stress?: None reported Does patient have children?: Yes How many children?: 2 How is patient's relationship with their children?: Unknown relationship  Childhood History:  Childhood History By whom was/is the patient raised?: Other (Comment) (Unknown) Additional childhood history information: None reported Description of patient's  relationship with caregiver when they were a child: None reported Patient's description of current relationship with people who raised him/her: None reported How were you disciplined when you got in trouble as a child/adolescent?: None reported Does patient have siblings?:  (Unknown) Did patient suffer any verbal/emotional/physical/sexual abuse as a child?: No Did patient suffer from severe childhood neglect?: No Has patient ever been sexually abused/assaulted/raped as an adolescent or adult?: No Was the patient ever a victim of a crime or a disaster?: No Witnessed domestic violence?: No Has patient been affected by domestic violence as an adult?: No  Child/Adolescent Assessment:     CCA Substance Use Alcohol/Drug Use: Alcohol / Drug Use Pain Medications: See MAR Prescriptions: See MAR Over the Counter: See MAR History of alcohol / drug use?: No history of alcohol / drug abuse                         ASAM's:  Six Dimensions of Multidimensional Assessment  Dimension 1:  Acute Intoxication and/or Withdrawal Potential:      Dimension 2:  Biomedical Conditions and Complications:      Dimension 3:  Emotional,  Behavioral, or Cognitive Conditions and Complications:     Dimension 4:  Readiness to Change:     Dimension 5:  Relapse, Continued use, or Continued Problem Potential:     Dimension 6:  Recovery/Living Environment:     ASAM Severity Score:    ASAM Recommended Level of Treatment:     Substance use Disorder (SUD)    Recommendations for Services/Supports/Treatments:   Per Psyc NP Elenore Paddy patient is recommended for Inpatient Hospitalization   DSM5 Diagnoses: Patient Active Problem List   Diagnosis Date Noted  . Suicidal thoughts 04/21/2020  . Depression, major, single episode, severe (HCC) 04/21/2020  . Hematuria 11/13/2014    Patient Centered Plan: Patient is on the following Treatment Plan(s):  Depression   Referrals to Alternative  Service(s): Referred to Alternative Service(s):   Place:   Date:   Time:    Referred to Alternative Service(s):   Place:   Date:   Time:    Referred to Alternative Service(s):   Place:   Date:   Time:    Referred to Alternative Service(s):   Place:   Date:   Time:     Keeon Zurn A Aristide Waggle, LCAS-A

## 2020-04-22 NOTE — ED Notes (Signed)
Meal tray placed in room 

## 2020-04-22 NOTE — ED Notes (Signed)
Pt sleeping. VS will be taken once awake. 

## 2020-04-22 NOTE — ED Notes (Addendum)
Pt refusing all blood work at this time and to be dressed out.  Pt continues to show paranoid behaviors.

## 2020-04-22 NOTE — Progress Notes (Signed)
Admission Note:  35 yr male who presents IVC in no acute distress for the treatment of SI and Depression. Patient upon arrival appears flat and sad, his affect is blunted, his thoughts are organized and coherent no bizarre behavior, he was calm and cooperative with admission process. He currently denies SI/HI/AVH and contracts for safety upon admission. Patient stated " I am currently going through a divorce" patient was offered emotional support and encouragement.   Patient has Past medical Hx of Depression and Suicidal thoughts, his skin was assessed in presence of Nurse Molli Hazard  and found to be clear of any abnormal marks,  In addition was searched and no contraband found, POC and unit policies explained, understanding verbalized and consents obtained. Patient was oriented to the unit, food and fluids offered, and he accepted. 15 minutes safety checks maintained will continue to monitor.

## 2020-04-22 NOTE — ED Notes (Signed)
Report given to Matt RN in BMU.  °

## 2020-04-22 NOTE — Consult Note (Signed)
[facility] Face-to-Face Psychiatry Consult   Reason for Consult: Suicidal  Referring Physician: Dr. Elesa Massed Patient Identification: MCIHAEL Burke MRN:  [id number] Principal Diagnosis: <principal problem not specified> Diagnosis:  Active Problems:   Suicidal thoughts   Depression, major, single episode, severe (HCC)   Total Time spent with patient: 30 minutes  Subjective:   Thomas Burke is a 35 y.o. male patient presented to [facility] ED via POV for suicidal ideation with past suicidal attempts. Per the ED triage nurse note, Pt to ED POV for SI. States he is "having a breakdown" and has tried to kill himself multiple times including throwing himself in front of a truck.  Pt with pressured speech, fidgeting in triage.  Appears paranoid. States he keeps smelling something burning everywhere he goes  The patient was initially voluntary but was placed under involuntary commitment status by the EDP. The patient disclosed he had had several suicidal attempts. He states he attempted to jump in front of a moving motor vehicle, the patient consumed 30 BC tablets, on las Friday but he was unsuccessful; the patient says he used cocaine about three weeks ago "I was hoping it would blow my heart out and kill me, but that did not work."  The patient states he has attempted other intentional drug overdoses but has been unsuccessful in ending his life.   The patient discloses he is divorced, homeless, and has two children. He states he works and believes his kids would be better off without him. The patient admitted he does not have a psychiatric diagnosis. He says he has never been hospitalized for any psychiatric problems. He disclosed he is not on any psychiatric medication currently or in the past.  This provider saw the patient face-to-face; the chart was reviewed and consulted with Dr. Elesa Massed on 04/22/2020. The patient's discussion with the EDP that the patient does meet the criteria to be admitted to the  psychiatric inpatient unit.  On evaluation, the patient is alert and oriented x 4, anxious, manic,  uncooperative, and mood-congruent with affect. The patient does not appear to respond to internal or external stimuli during his assessment, but it was reported that he presented with those symptoms during triage. The patient is presenting with some delusional thinking. The patient denies auditory or visual hallucinations. The patient admitted to suicidal ideation and many suicidal attempts but has been unsuccessful. The patient denies homicidal or self-harm ideations. The patient is presenting with some psychotic and paranoid behaviors. During an encounter with the patient, he answered questions and participated in the assessment process. The patient voiced he drove himself to the hospital "because I needed to get the care." Plan: The patient is a safety risk to himself others and does require psychiatric inpatient admission for stabilization and treatment.  HPI:    Past Psychiatric History: Per patient, no past pertinent psychiatric history  Risk to Self:  Yes Risk to Others:  Yes Prior Inpatient Therapy:  Unknown Prior Outpatient Therapy:  Unknown  Past Medical History:  Past Medical History:  Diagnosis Date  . Depression   . Migraine   . Persistent headaches   . Seasonal allergies     Past Surgical History:  Procedure Laterality Date  . none     Family History:  Family History  Problem Relation Age of Onset  . Depression Mother    Family Psychiatric  History: Patient states, none Social History:  Social History   Substance and Sexual Activity  Alcohol Use Yes  .  Alcohol/week: 0.0 standard drinks     Social History   Substance and Sexual Activity  Drug Use No    Social History   Socioeconomic History  . Marital status: Married    Spouse name: Not on file  . Number of children: Not on file  . Years of education: Not on file  . Highest education level: Not on file   Occupational History  . Not on file  Tobacco Use  . Smoking status: Current Every Day Smoker    Packs/day: 1.50    Years: 12.00    Pack years: 18.00    Types: Cigarettes  . Smokeless tobacco: Never Used  Vaping Use  . Vaping Use: Never used  Substance and Sexual Activity  . Alcohol use: Yes    Alcohol/week: 0.0 standard drinks  . Drug use: No  . Sexual activity: Not on file  Other Topics Concern  . Not on file  Social History Narrative  . Not on file   Social Determinants of Health   Financial Resource Strain: Not on file  Food Insecurity: Not on file  Transportation Needs: Not on file  Physical Activity: Not on file  Stress: Not on file  Social Connections: Not on file   Additional Social History:    Allergies:  No Known Allergies  Labs: No results found for this or any previous visit (from the past 48 hour(s)).  Current Facility-Administered Medications  Medication Dose Route Frequency Provider Last Rate Last Admin  . LORazepam (ATIVAN) tablet 2 mg  2 mg Oral Once Ward, Kristen N, DO       Current Outpatient Medications  Medication Sig Dispense Refill  . aspirin-acetaminophen-caffeine (EXCEDRIN MIGRAINE) 250-250-65 MG tablet Take by mouth every 6 (six) hours as needed for headache.    . dicyclomine (BENTYL) 10 MG capsule Take 1 capsule (10 mg total) by mouth 4 (four) times daily -  before meals and at bedtime. As needed for cramping abdominal pain 30 capsule 0  . ibuprofen (ADVIL,MOTRIN) 200 MG tablet Take 200 mg by mouth every 6 (six) hours as needed.    . ondansetron (ZOFRAN ODT) 4 MG disintegrating tablet Take 1 tablet (4 mg total) by mouth every 8 (eight) hours as needed for nausea or vomiting. 30 tablet 0    Musculoskeletal: Strength & Muscle Tone: within normal limits Gait & Station: normal Patient leans: N/A  Psychiatric Specialty Exam: Physical Exam Vitals and nursing note reviewed.  Constitutional:      General: He is in acute distress.  HENT:      Right Ear: External ear normal.     Left Ear: External ear normal.     Nose: Nose normal.     Mouth/Throat:     Mouth: Mucous membranes are moist.  Cardiovascular:     Rate and Rhythm: Tachycardia present.  Pulmonary:     Effort: Pulmonary effort is normal.  Musculoskeletal:        General: Normal range of motion.     Cervical back: Normal range of motion and neck supple.  Neurological:     General: No focal deficit present.     Mental Status: He is alert and oriented to person, place, and time.  Psychiatric:        Attention and Perception: Attention normal.        Mood and Affect: Mood is anxious and depressed. Affect is inappropriate.        Speech: Speech is rapid and pressured and tangential.  Behavior: Behavior is uncooperative, agitated and withdrawn.        Thought Content: Thought content is paranoid and delusional. Thought content includes suicidal ideation. Thought content includes suicidal plan.        Cognition and Memory: Cognition is impaired.        Judgment: Judgment is impulsive and inappropriate.     Review of Systems  Psychiatric/Behavioral: Positive for agitation, behavioral problems, self-injury, sleep disturbance and suicidal ideas. The patient is nervous/anxious.     Blood pressure (!) 125/98, pulse (!) 109, temperature 98.9 F (37.2 C), temperature source Oral, resp. rate 20, height 5\' 8"  (1.727 m), weight 56.7 kg, SpO2 98 %.Body mass index is 19.01 kg/m.  General Appearance: Bizarre, Disheveled and Guarded  Eye Contact:  Fair  Speech:  Garbled and Pressured  Volume:  Normal  Mood:  Anxious, Depressed, Hopeless, Irritable and Worthless  Affect:  Blunt, Depressed, Inappropriate and Full Range  Thought Process:  Disorganized and Irrelevant  Orientation:  Full (Time, Place, and Person)  Thought Content:  Illogical, Delusions, Ilusions, Obsessions, Paranoid Ideation and Rumination  Suicidal Thoughts:  Yes.  with intent/plan  Homicidal Thoughts:   No  Memory:  Immediate;   Fair Recent;   Fair Remote;   Fair  Judgement:  Impaired  Insight:  Lacking  Psychomotor Activity:  Increased  Concentration:  Concentration: Poor and Attention Span: Poor  Recall:  of Knowledge:  Fair  Language:  Fair  Akathisia:  Negative  Handed:  Right  AIMS (if indicated):     Assets:  Communication Skills Desire for Improvement Financial Resources/Insurance Housing Physical Health Resilience Social Support  ADL's:  Intact  Cognition:  WNL  Sleep:    Insomnia     Treatment Plan Summary: Plan Plan: The patient is a safety risk to himself, others and does require psychiatric inpatient admission for stabilization and treatment.  Disposition: Recommend psychiatric Inpatient admission when medically cleared. Supportive therapy provided about ongoing stressors.  [employer], NP 04/22/2020 5:07 AM

## 2020-04-22 NOTE — BH Assessment (Signed)
Patient is to be admitted to [facility] after 8:00pm by Dr. Neale Burly.  Attending Physician will be Dr. Toni Amend.   Patient has been assigned to room 310, by [facility] Charge Nurse Megan.     ER staff is aware of the admission:  Luann, ER Secretary    Frazier Richards, ER MD   Amy,Patient's Nurse   Ethelene Browns, Patient Access.

## 2020-04-22 NOTE — Consult Note (Signed)
[facility] Face-to-Face Psychiatry Consult   Reason for Consult: Consult for 35 year old man who came to the emergency room last night saying he was suicidal Referring Physician: Katrinka Blazing Patient Identification: JAHZIR STROHMEIER MRN:  [id number] Principal Diagnosis: Depression, major, single episode, severe (HCC) Diagnosis:  Principal Problem:   Depression, major, single episode, severe (HCC) Active Problems:   Suicidal thoughts   Brief reactive psychosis (HCC)   Leukocytosis   Total Time spent with patient: 1 hour  Subjective:   MIRON MARXEN is a 35 y.o. male patient admitted with patient currently nonverbal.  HPI: Patient seen chart reviewed.  Reviewed notes from last night as well as labs.  Attempted to interview the patient multiple times throughout the day.  He remained asleep.  He would occasionally open his eyes to his name but then would close them and roll back over.  Never answer the question directly.  According to the notes he came in last night agitated hyperactive stating that he wanted to kill himself.  York Spaniel that he was going through a divorce.  Denied substance abuse.  Drug screen is negative no alcohol in the system.  No evidence of overdose.  Labs largely unremarkable except for a white count of 22 but no evidence of any focal infection or appearance of illness.  Patient was given Haldol and Ativan this morning and has been asleep ever since.  Past Psychiatric History: Little past history.  No known past hospitalizations or suicide attempts  Risk to Self:   Risk to Others:   Prior Inpatient Therapy:   Prior Outpatient Therapy:    Past Medical History:  Past Medical History:  Diagnosis Date  . Depression   . Migraine   . Persistent headaches   . Seasonal allergies     Past Surgical History:  Procedure Laterality Date  . none     Family History:  Family History  Problem Relation Age of Onset  . Depression Mother    Family Psychiatric  History:  Unknown Social History:  Social History   Substance and Sexual Activity  Alcohol Use Yes  . Alcohol/week: 0.0 standard drinks     Social History   Substance and Sexual Activity  Drug Use No    Social History   Socioeconomic History  . Marital status: Legally Separated    Spouse name: Not on file  . Number of children: Not on file  . Years of education: Not on file  . Highest education level: Not on file  Occupational History  . Not on file  Tobacco Use  . Smoking status: Current Every Day Smoker    Packs/day: 1.50    Years: 12.00    Pack years: 18.00    Types: Cigarettes  . Smokeless tobacco: Never Used  Vaping Use  . Vaping Use: Never used  Substance and Sexual Activity  . Alcohol use: Yes    Alcohol/week: 0.0 standard drinks  . Drug use: No  . Sexual activity: Not on file  Other Topics Concern  . Not on file  Social History Narrative  . Not on file   Social Determinants of Health   Financial Resource Strain: Not on file  Food Insecurity: Not on file  Transportation Needs: Not on file  Physical Activity: Not on file  Stress: Not on file  Social Connections: Not on file   Additional Social History:    Allergies:  No Known Allergies  Labs:  Results for orders placed or performed during the hospital encounter of  04/22/20 (from the past 48 hour(s))  Comprehensive metabolic panel     Status: Abnormal   Collection Time: 04/22/20  6:15 AM  Result Value Ref Range   Sodium 136 135 - 145 mmol/L   Potassium 3.3 (L) 3.5 - 5.1 mmol/L   Chloride 99 98 - 111 mmol/L   CO2 23 22 - 32 mmol/L   Glucose, Bld 94 70 - 99 mg/dL    Comment: Glucose reference range applies only to samples taken after fasting for at least 8 hours.   BUN <5 (L) 6 - 20 mg/dL   Creatinine, Ser 7.26 0.61 - 1.24 mg/dL   Calcium 9.2 8.9 - 20.3 mg/dL   Total Protein 7.4 6.5 - 8.1 g/dL   Albumin 4.5 3.5 - 5.0 g/dL   AST 16 15 - 41 U/L   ALT 9 0 - 44 U/L   Alkaline Phosphatase 52 38 - 126 U/L    Total Bilirubin 0.6 0.3 - 1.2 mg/dL   GFR, Estimated >55 >97 mL/min    Comment: (NOTE) Calculated using the CKD-EPI Creatinine Equation (2021)    Anion gap 14 5 - 15    Comment: Performed at [facility], [address] [place], [place] [address]  Ethanol     Status: None   Collection Time: 04/22/20  6:15 AM  Result Value Ref Range   Alcohol, Ethyl (B) <10 <10 mg/dL    Comment: (NOTE) Lowest detectable limit for serum alcohol is 10 mg/dL.  For medical purposes only. Performed at [facility], [address] [place], [place] [address]   Salicylate level     Status: None   Collection Time: 04/22/20  6:15 AM  Result Value Ref Range   Salicylate Lvl 20.2 7.0 - 30.0 mg/dL    Comment: Performed at [facility], [address] [place], [place] [address]  Acetaminophen level     Status: Abnormal   Collection Time: 04/22/20  6:15 AM  Result Value Ref Range   Acetaminophen (Tylenol), Serum <10 (L) 10 - 30 ug/mL    Comment: (NOTE) Therapeutic concentrations vary significantly. A range of 10-30 ug/mL  may be an effective concentration for many patients. However, some  are best treated at concentrations outside of this range. Acetaminophen concentrations >150 ug/mL at 4 hours after ingestion  and >50 ug/mL at 12 hours after ingestion are often associated with  toxic reactions.  Performed at [facility], [address] [place], [place] [address]   cbc     Status: Abnormal   Collection Time: 04/22/20  6:15 AM  Result Value Ref Range   WBC 22.9 (H) 4.0 - 10.5 K/uL   RBC 4.72 4.22 - 5.81 MIL/uL   Hemoglobin 15.3 13.0 - 17.0 g/dL   HCT 25.0 03.7 - 04.8 %   MCV 87.3 80.0 - 100.0 fL   MCH 32.4 26.0 - 34.0 pg   MCHC 37.1 (H) 30.0 - 36.0 g/dL   RDW 88.9 16.9 - 45.0 %   Platelets 338 150 - 400 K/uL   nRBC 0.0 0.0 - 0.2 %    Comment: Performed at [facility], [address], [place], [place] [address]  Urine Drug Screen,  Qualitative     Status: None   Collection Time: 04/22/20  6:15 AM  Result Value Ref Range   Tricyclic, Ur Screen NONE DETECTED NONE DETECTED   Amphetamines, Ur Screen NONE DETECTED NONE DETECTED   MDMA (Ecstasy)Ur Screen NONE DETECTED NONE DETECTED  Cocaine Metabolite,Ur McBee NONE DETECTED NONE DETECTED   Opiate, Ur Screen NONE DETECTED NONE DETECTED   Phencyclidine (PCP) Ur S NONE DETECTED NONE DETECTED   Cannabinoid 50 Ng, Ur Maynard NONE DETECTED NONE DETECTED   Barbiturates, Ur Screen NONE DETECTED NONE DETECTED   Benzodiazepine, Ur Scrn NONE DETECTED NONE DETECTED   Methadone Scn, Ur NONE DETECTED NONE DETECTED    Comment: (NOTE) Tricyclics + metabolites, urine    Cutoff 1000 ng/mL Amphetamines + metabolites, urine  Cutoff 1000 ng/mL MDMA (Ecstasy), urine              Cutoff 500 ng/mL Cocaine Metabolite, urine          Cutoff 300 ng/mL Opiate + metabolites, urine        Cutoff 300 ng/mL Phencyclidine (PCP), urine         Cutoff 25 ng/mL Cannabinoid, urine                 Cutoff 50 ng/mL Barbiturates + metabolites, urine  Cutoff 200 ng/mL Benzodiazepine, urine              Cutoff 200 ng/mL Methadone, urine                   Cutoff 300 ng/mL  The urine drug screen provides only a preliminary, unconfirmed analytical test result and should not be used for non-medical purposes. Clinical consideration and professional judgment should be applied to any positive drug screen result due to possible interfering substances. A more specific alternate chemical method must be used in order to obtain a confirmed analytical result. Gas chromatography / mass spectrometry (GC/MS) is the preferred confirm atory method. Performed at [facility], [address] [place], [place] [address]   Resp Panel by RT-PCR (Flu A&B, Covid) Nasopharyngeal Swab     Status: None   Collection Time: 04/22/20  6:15 AM   Specimen: Nasopharyngeal Swab; Nasopharyngeal(NP) swabs in vial transport medium  Result  Value Ref Range   SARS Coronavirus 2 by RT PCR NEGATIVE NEGATIVE    Comment: (NOTE) SARS-CoV-2 target nucleic acids are NOT DETECTED.  The SARS-CoV-2 RNA is generally detectable in upper respiratory specimens during the acute phase of infection. The lowest concentration of SARS-CoV-2 viral copies this assay can detect is 138 copies/mL. A negative result does not preclude SARS-Cov-2 infection and should not be used as the sole basis for treatment or other patient management decisions. A negative result may occur with  improper specimen collection/handling, submission of specimen other than nasopharyngeal swab, presence of viral mutation(s) within the areas targeted by this assay, and inadequate number of viral copies(<138 copies/mL). A negative result must be combined with clinical observations, patient history, and epidemiological information. The expected result is Negative.  Fact Sheet for Patients:  BloggerCourse.com  Fact Sheet for Healthcare Providers:  SeriousBroker.it  This test is no t yet approved or cleared by the [place] FDA and  has been authorized for detection and/or diagnosis of SARS-CoV-2 by FDA under an Emergency Use Authorization (EUA). This EUA will remain  in effect (meaning this test can be used) for the duration of the COVID-19 declaration under Section 564(b)(1) of the Act, 21 U.S.C.section 360bbb-3(b)(1), unless the authorization is terminated  or revoked sooner.       Influenza A by PCR NEGATIVE NEGATIVE   Influenza B by PCR NEGATIVE NEGATIVE    Comment: (NOTE) The Xpert Xpress SARS-CoV-2/FLU/RSV plus assay is intended as an aid in the diagnosis of  influenza from Nasopharyngeal swab specimens and should not be used as a sole basis for treatment. Nasal washings and aspirates are unacceptable for Xpert Xpress SARS-CoV-2/FLU/RSV testing.  Fact Sheet for  Patients: BloggerCourse.com  Fact Sheet for Healthcare Providers: SeriousBroker.it  This test is not yet approved or cleared by the [place] FDA and has been authorized for detection and/or diagnosis of SARS-CoV-2 by FDA under an Emergency Use Authorization (EUA). This EUA will remain in effect (meaning this test can be used) for the duration of the COVID-19 declaration under Section 564(b)(1) of the Act, 21 U.S.C. section 360bbb-3(b)(1), unless the authorization is terminated or revoked.  Performed at [facility], [address] [place], [place] [address]     No current facility-administered medications for this encounter.   Current Outpatient Medications  Medication Sig Dispense Refill  . aspirin-acetaminophen-caffeine (EXCEDRIN MIGRAINE) 250-250-65 MG tablet Take by mouth every 6 (six) hours as needed for headache.    . dicyclomine (BENTYL) 10 MG capsule Take 1 capsule (10 mg total) by mouth 4 (four) times daily -  before meals and at bedtime. As needed for cramping abdominal pain (Patient not taking: Reported on 04/22/2020) 30 capsule 0  . ibuprofen (ADVIL,MOTRIN) 200 MG tablet Take 200 mg by mouth every 6 (six) hours as needed.    . ondansetron (ZOFRAN ODT) 4 MG disintegrating tablet Take 1 tablet (4 mg total) by mouth every 8 (eight) hours as needed for nausea or vomiting. (Patient not taking: Reported on 04/22/2020) 30 tablet 0    Musculoskeletal: Strength & Muscle Tone: within normal limits Gait & Station: normal Patient leans: N/A  Psychiatric Specialty Exam: Physical Exam Vitals and nursing note reviewed.  Constitutional:      Appearance: He is well-developed and well-nourished.  HENT:     Head: Normocephalic and atraumatic.  Eyes:     Conjunctiva/sclera: Conjunctivae normal.     Pupils: Pupils are equal, round, and reactive to light.  Cardiovascular:     Heart sounds: Normal heart sounds.   Pulmonary:     Effort: Pulmonary effort is normal.  Abdominal:     Palpations: Abdomen is soft.  Musculoskeletal:        General: Normal range of motion.     Cervical back: Normal range of motion.  Skin:    General: Skin is warm and dry.  Neurological:     General: No focal deficit present.     Mental Status: He is alert.  Psychiatric:        Attention and Perception: He is inattentive.        Speech: He is noncommunicative.     Review of Systems  Unable to perform ROS: Patient nonverbal    Blood pressure (!) 125/98, pulse (!) 109, temperature 98.9 F (37.2 C), temperature source Oral, resp. rate 20, height 5\' 8"  (1.727 m), weight 56.7 kg, SpO2 98 %.Body mass index is 19.01 kg/m.  General Appearance: Disheveled  Eye Contact:  Minimal  Speech:  Blocked  Volume:  Decreased  Mood:  Depressed  Affect:  Congruent  Thought Process:  Disorganized  Orientation:  Negative  Thought Content:  Negative  Suicidal Thoughts:  Yes.  with intent/plan  Homicidal Thoughts:  No  Memory:  Immediate;   Fair Recent;   Poor Remote;   Poor  Judgement:  Impaired  Insight:  Shallow  Psychomotor Activity:  Decreased  Concentration:  Concentration: Poor  Recall:  Poor  Fund of Knowledge:  Poor  Language:  Poor  Akathisia:  No  Handed:  Right  AIMS (if indicated):     Assets:  Physical Health Resilience  ADL's:  Impaired  Cognition:  Impaired,  Mild  Sleep:        Treatment Plan Summary: Medication management and Plan Patient currently remains asleep.  Based on his presentation he needs inpatient hospitalization.  Orders will be prepared for admission to the psychiatric ward.  I have ordered a repeat CBC.  The white count right now does not seem to be part of an obviously larger medical issue and can be worked up after inpatient admission.  I am going to order a head CT if one has not yet been done since it is stated in the intake that the patient said he thought it smelled things  burning.  I will try to get collateral information if possible but ultimately the plan is admission to the psychiatric ward Case reviewed with emergency room physician and TTS.  Disposition: Recommend psychiatric Inpatient admission when medically cleared.  Mordecai RasmussenJohn Clapacs, MD 04/22/2020 3:52 PM

## 2020-04-22 NOTE — ED Notes (Addendum)
Pt provided pillow and warm blanket. Pt sitting on edge of bed. Pt appears in NAD at this time.

## 2020-04-22 NOTE — Tx Team (Signed)
Initial Treatment Plan 04/22/2020 10:36 PM Thomas Burke ZLD:[id number]    PATIENT STRESSORS: Marital or family conflict Medication change or noncompliance   PATIENT STRENGTHS: Motivation for treatment/growth Supportive family/friends   PATIENT IDENTIFIED PROBLEMS: Suicidal ideation     Depression                  DISCHARGE CRITERIA:  Improved stabilization in mood, thinking, and/or behavior Motivation to continue treatment in a less acute level of care  PRELIMINARY DISCHARGE PLAN: Outpatient therapy  PATIENT/FAMILY INVOLVEMENT: This treatment plan has been presented to and reviewed with the patient, Thomas Burke, The patient and family have been given the opportunity to ask questions and make suggestions.  Trula Ore, RN 04/22/2020, 10:36 PM

## 2020-04-22 NOTE — ED Notes (Signed)
Pt wanded my Engineer, materials before entering room. Pt removed phone, wallet, and keys and secured in pt belonging bag.   Pt in room, stated "I think I made a mistake by coming here." This RN assured him that he did the right thing by coming to the ED and that we will try to get him the help the he needs here. Pt asked, "am I being locked up?" RN reassured pt that he was placed in this room as a holding area and so that pt could have own space.   Pt remains calm with paranoid behaviors and tremors.

## 2020-04-23 DIAGNOSIS — F322 Major depressive disorder, single episode, severe without psychotic features: Principal | ICD-10-CM

## 2020-04-23 LAB — RPR: RPR Ser Ql: NONREACTIVE

## 2020-04-23 LAB — HEMOGLOBIN A1C
Hgb A1c MFr Bld: 5.3 % (ref 4.8–5.6)
Mean Plasma Glucose: 105.41 mg/dL

## 2020-04-23 MED ORDER — CITALOPRAM HYDROBROMIDE 20 MG PO TABS
20.0000 mg | ORAL_TABLET | Freq: Every day | ORAL | Status: DC
  Administered 2020-04-23 – 2020-04-24 (×2): 20 mg via ORAL
  Filled 2020-04-23 (×2): qty 1

## 2020-04-23 NOTE — Progress Notes (Signed)
Recreation Therapy Notes  Date: 04/23/2020  Time: 10:00 am   Location: Craft room  Behavioral response: N/A   Intervention Topic: Animal Assisted therapy    Discussion/Intervention: Patient did not attend group.   Clinical Observations/Feedback:  Patient did not attend group.   Parks Czajkowski LRT/CTRS         Thomas Burke 04/23/2020 12:38 PM 

## 2020-04-23 NOTE — Progress Notes (Signed)
Patient alert and oriented x 4, affect is flat but brightens upon approach no distress noted, he denies SI/HI/AVH no distress noted interacting appropriately with peers and staff, 15 minutes safety checks maintained will continue to monitor.

## 2020-04-23 NOTE — Progress Notes (Signed)
Recreation Therapy Notes  INPATIENT RECREATION THERAPY ASSESSMENT  Patient Details Name: Thomas Burke MRN: [id number] DOB: March 04, 1986 Today's Date: 04/23/2020       Information Obtained From: Patient  Able to Participate in Assessment/Interview: Yes  Patient Presentation: Responsive  Reason for Admission (Per Patient): Active Symptoms  Patient Stressors:    Coping Skills:   Other (Comment) (Video games, Smoke)  Leisure Interests (2+):  Music - Write music,Music - Listen,Games - Video games,Individual - Reading  Frequency of Recreation/Participation: Weekly  Awareness of Community Resources:  Yes  Community Resources:  Gym  Current Use: Yes  If no, Barriers?:    Expressed Interest in [employer] Information: No  County of Residence:  [place]  Patient Main Form of Transportation: Set designer  Patient Strengths:  Hardworking, listening  Patient Identified Areas of Improvement:  N/A  Patient Goal for Hospitalization:  Feel better  Current SI (including self-harm):  No  Current HI:  No  Current AVH: No  Staff Intervention Plan: Group Attendance,Collaborate with Interdisciplinary Treatment Team  Consent to Intern Participation: N/A  Thomas Burke 04/23/2020, 4:25 PM

## 2020-04-23 NOTE — BHH Counselor (Signed)
Adult Comprehensive Assessment  Patient ID: Thomas Burke, male   DOB: 14-Nov-1985, 35 y.o.   MRN: [id number]  Information Source: Information source: Patient  Current Stressors:  Patient states their primary concerns and needs for treatment are:: "I was haing a real bad time and kind of lost it a little bit" Patient states their goals for this hospitilization and ongoing recovery are:: "finish getting my house ready and see my kids and start my new jobAnimator / Learning stressors: Pt denies. Employment / Job issues: "I work all the time" Family Relationships: "I'm going through a divorceEngineer, petroleum / Lack of resources (include bankruptcy): "dealing with the finances associated with a divorce" Housing / Lack of housing: Pt denies. Physical health (include injuries & life threatening diseases): Pt denies. Social relationships: "I had a relationship that went [place] and recently all communication stopped" Substance abuse: Pt denies. Bereavement / Loss: Pt denies.  Living/Environment/Situation:  Living Arrangements: Parent Who else lives in the home?: "dad and step-mom" How long has patient lived in current situation?: "a week" What is atmosphere in current home: Comfortable,Loving,Supportive  Family History:  Marital status: Separated Separated, when?: "October" What types of issues is patient dealing with in the relationship?: Patient is currently going through a divorce with his wife Does patient have children?: Yes How many children?: 2 How is patient's relationship with their children?: Ages 63 and 7.  "they're great"  Childhood History:  By whom was/is the patient raised?: Both parents Description of patient's relationship with caregiver when they were a child: "not too bad" Patient's description of current relationship with people who raised him/her: "same" How were you disciplined when you got in trouble as a child/adolescent?: "yelled at" Does patient have  siblings?: Yes Number of Siblings: 2 Description of patient's current relationship with siblings: "I don't see them much.  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" How long has patient been employed?: "15 years" Patient's job has been impacted by current illness: Yes Describe how patient's job has been impacted: "I left work early on Tuesday because I needed a mental health day" What is the longest time patient has a held a job?: "11 years" Where was the patient employed at that time?: "[employer]" Has patient ever been in the [employer]?: No  Financial Resources:   Financial resources: Income from employment Does patient have a representative payee or guardian?: No  Alcohol/Substance Abuse:   What has been your use of drugs/alcohol within the last 12 months?: Pt denies. If attempted suicide, did drugs/alcohol play a role in this?: No Alcohol/Substance Abuse Treatment Hx: Denies past history Has alcohol/substance abuse ever caused legal problems?: No  Social Support System:   Patient's Community Support System: Good Describe Community Support System: "dad, mom and ex-wife" Type of faith/religion: Pt denies. How does patient's faith help to cope with current illness?: Pt denies.  Leisure/Recreation:   Do You Have Hobbies?: Yes Leisure and Hobbies: "writing and making  music"  Strengths/Needs:  What is the patient's perception of their strengths?: "I'm fast.  I work hard.  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 Patient states they will know when they are safe and ready for discharge when: "I know I am safe now" Does patient have access to transportation?: Yes (Pt reports car is on campus.) Does patient have financial barriers related to discharge medications?: Yes Patient description of barriers related to discharge medications: Chart indicates that he does not have insurance. Will patient be returning to same living situation after discharge?: Yes  Summary/Recommendations:   Summary and Recommendations (to be completed by the evaluator): Patient is a 35 year old male from [place], [place] [facility]).  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  He presents to the hospital with concerns for increasing depression and suicidal ideation with different plans. He reports that a trigger for current mood and mental state has been a recent separation from his wife and ongoing divorce.  Recommendations include: crisis stabilization, therapeutic milieu, encourage group attendance and participation, medication management for detox/mood stabilization and development of comprehensive mental wellness/sobriety plan.  Harden Mo. 04/23/2020

## 2020-04-23 NOTE — Progress Notes (Signed)
Patient is anxious on assessment. Pt denies SI, HI, and AVH. He states he needs to go home because he has bills to pay and things to do, so he cannot stay long. Pt was told that the doctor would see him this morning and do her assessment. Pt was given Vistaril for anxiety and has not had any other complaints since.   Pt remains safe on the unit at this time. Q15 min safety checks are maintained.

## 2020-04-23 NOTE — H&P (Addendum)
Psychiatric Admission Assessment Adult  Patient Identification: Thomas Burke MRN:  [id number] Date of Evaluation:  04/23/2020 Chief Complaint:  Severe major depression, single episode, with psychotic features (HCC) [F32.3] Principal Diagnosis: MDD (major depressive disorder), single episode, severe , no psychosis (HCC) Diagnosis:  Principal Problem:   MDD (major depressive disorder), single episode, severe , no psychosis (HCC)  CC "I need to get back home" History of Present Illness: 35 year old male who presented to the emergency room stating he was suicidal. No acute events overnight, attending to ADLs.  Patient seen one-on-one today. He notes that he had several life stressors happening to him recently. He is separated from his wife, and going through a divorce. They separated in October, and then moved in with his girlfriend from work in December. She has subsequently broken up with him over Valentine's Day, and he became increasingly depressed. He does bring up impulsively thinking about driving to [place] and then [place], but stopped himself. He mentions taking BC powders last week in a suicide attempt, but forced himself to vomit afterward. He does not bring up other suicide attempts with this provider, but per chart review he also mentioned trying to throw himself in front of a truck in the emergency room. He also mentioned to ED provider about trying cocaine three weeks ago to try an induce a heart attack. Patient currently denies suicidal ideations, homicidal ideations, visual hallucinations, or auditory hallucinations. He states he has a lot to live for including his children ages 1511 and 547. He recently purchased a new house, and is starting a new job on Monday. He is also temporarily staying with his father until the house in ready. Given concerns about impulsive decision to drive across country, we did perform a mood disorder questionnaire. This screening was negative for bipolar  disorder. He requested to be started on Citalopram which worked well for his friend and ex-wife for depression. He also requests that we call his father and ex-wife.   Called Thomas LoftJames Burke, father, [phone]: Father reports that Thomas Burke sounded like his usual self on the phone today. He does note that his son is going through hard times, and at times hides his feelings. Father reports that Thomas Burke did see a counselor as a teenager while his and his wife underwent a divorce. He confirms there are no guns in the household, and does feel safe with Thomas Burke returning home. He also notes that he will keep an eye on his son.   Called wife from who he is separated,  Thomas MusaKelsey Burke, [phone]: She reports that Thomas Burke has suffered from depression for awhile, but she could never convince him to seek professional help. She does note that he has several good things happening in his life that should make him want to live such as new job, kids, and new house. She feels this episode was triggered from fight with his girlfriend. She also feels that depression runs in his family.   Associated Signs/Symptoms: Depression Symptoms:  depressed mood, recurrent thoughts of death, suicidal attempt, Duration of Depression Symptoms: Greater than two weeks  (Hypo) Manic Symptoms:  Impulsivity, Anxiety Symptoms:  Excessive Worry, Psychotic Symptoms:  Denies Duration of Psychotic Symptoms: No data recorded PTSD Symptoms: Negative Total Time spent with patient: 1 hour  Past Psychiatric History: Patient saw a therapist as a teenager during his parents divorce. No history of hospitilization or previous drug trials. Patient admits to suicide attempts prior to hospital stay.   Is the patient at risk  to self? Yes.    Has the patient been a risk to self in the past 6 months? No.  Has the patient been a risk to self within the distant past? No.  Is the patient a risk to others? No.  Has the patient been a risk to  others in the past 6 months? No.  Has the patient been a risk to others within the distant past? No.   Prior Inpatient Therapy:   Prior Outpatient Therapy:    Alcohol Screening: 1. How often do you have a drink containing alcohol?: 2 to 3 times a week 2. How many drinks containing alcohol do you have on a typical day when you are drinking?: 3 or 4 3. How often do you have six or more drinks on one occasion?: Never AUDIT-C Score: 4 4. How often during the last year have you found that you were not able to stop drinking once you had started?: Never 5. How often during the last year have you failed to do what was normally expected from you because of drinking?: Never 6. How often during the last year have you needed a first drink in the morning to get yourself going after a heavy drinking session?: Never 7. How often during the last year have you had a feeling of guilt of remorse after drinking?: Never 8. How often during the last year have you been unable to remember what happened the night before because you had been drinking?: Never 9. Have you or someone else been injured as a result of your drinking?: No 10. Has a relative or friend or a doctor or another health worker been concerned about your drinking or suggested you cut down?: No Alcohol Use Disorder Identification Test Final Score (AUDIT): 4 Alcohol Brief Interventions/Follow-up: AUDIT Score <7 follow-up not indicated Substance Abuse History in the last 12 months:  No. Consequences of Substance Abuse: Negative Previous Psychotropic Medications: No  Psychological Evaluations: Yes  Past Medical History:  Past Medical History:  Diagnosis Date  . Depression   . Migraine   . Persistent headaches   . Seasonal allergies     Past Surgical History:  Procedure Laterality Date  . none     Family History:  Family History  Problem Relation Age of Onset  . Depression Mother    Family Psychiatric  History: Patient denies history of  mental illness, substance abuse, or suicide attempts in the family. Per chart review, mother with depression Tobacco Screening: Have you used any form of tobacco in the last 30 days? (Cigarettes, Smokeless Tobacco, Cigars, and/or Pipes): Yes Tobacco use, Select all that apply: 4 or less cigarettes per day Are you interested in Tobacco Cessation Medications?: Yes, will notify MD for an order Counseled patient on smoking cessation including recognizing danger situations, developing coping skills and basic information about quitting provided: Yes Social History:  Social History   Substance and Sexual Activity  Alcohol Use Yes  . Alcohol/week: 2.0 standard drinks  . Types: 2 Cans of beer per week     Social History   Substance and Sexual Activity  Drug Use No    Additional Social History: Marital status: Separated Separated, when?: "October" What types of issues is patient dealing with in the relationship?: Patient is currently going through a divorce with his wife Does patient have children?: Yes How many children?: 2 How is patient's relationship with their children?: Ages 61 and 7.  "they're great"  Allergies:  No Known Allergies Lab Results:  Results for orders placed or performed during the hospital encounter of 04/22/20 (from the past 48 hour(s))  CBC with Differential/Platelet     Status: None   Collection Time: 04/22/20  8:57 PM  Result Value Ref Range   WBC 9.0 4.0 - 10.5 K/uL   RBC 4.91 4.22 - 5.81 MIL/uL   Hemoglobin 15.6 13.0 - 17.0 g/dL   HCT 30.8 65.7 - 84.6 %   MCV 89.0 80.0 - 100.0 fL   MCH 31.8 26.0 - 34.0 pg   MCHC 35.7 30.0 - 36.0 g/dL   RDW 96.2 95.2 - 84.1 %   Platelets 333 150 - 400 K/uL   nRBC 0.0 0.0 - 0.2 %   Neutrophils Relative % 61 %   Neutro Abs 5.5 1.7 - 7.7 K/uL   Lymphocytes Relative 26 %   Lymphs Abs 2.4 0.7 - 4.0 K/uL   Monocytes Relative 10 %   Monocytes Absolute 0.9 0.1 - 1.0 K/uL   Eosinophils Relative  2 %   Eosinophils Absolute 0.2 0.0 - 0.5 K/uL   Basophils Relative 1 %   Basophils Absolute 0.1 0.0 - 0.1 K/uL   Immature Granulocytes 0 %   Abs Immature Granulocytes 0.03 0.00 - 0.07 K/uL    Comment: Performed at [facility], [address] [place], [place] [address]  Lipid panel     Status: None   Collection Time: 04/22/20  8:57 PM  Result Value Ref Range   Cholesterol 144 0 - 200 mg/dL   Triglycerides 75 <102 mg/dL   HDL 45 >72 mg/dL   Total CHOL/HDL Ratio 3.2 RATIO   VLDL 15 0 - 40 mg/dL   LDL Cholesterol 84 0 - 99 mg/dL    Comment:        Total Cholesterol/HDL:CHD Risk Coronary Heart Disease Risk Table                     Men   Women  1/2 Average Risk   3.4   3.3  Average Risk       5.0   4.4  2 X Average Risk   9.6   7.1  3 X Average Risk  23.4   11.0        Use the calculated Patient Ratio above and the CHD Risk Table to determine the patient's CHD Risk.        ATP III CLASSIFICATION (LDL):  <100     mg/dL   Optimal  536-644  mg/dL   Near or Above                    Optimal  130-159  mg/dL   Borderline  034-742  mg/dL   High  >595     mg/dL   Very High Performed at [facility], [address] [place], [place] [address]   Hemoglobin A1c     Status: None   Collection Time: 04/22/20  8:57 PM  Result Value Ref Range   Hgb A1c MFr Bld 5.3 4.8 - 5.6 %    Comment: (NOTE) Pre diabetes:          5.7%-6.4%  Diabetes:              >6.4%  Glycemic control for   <7.0% adults with diabetes    Mean Plasma Glucose 105.41 mg/dL    Comment: Performed at [facility], [address],  [place], [place] [address]    Blood Alcohol level:  Lab Results  Component Value Date   ETH <10 04/22/2020   ETH <10 09/23/2017    Metabolic Disorder Labs:  Lab Results  Component Value Date   HGBA1C 5.3 04/22/2020   MPG 105.41 04/22/2020   MPG 108.28 04/22/2020   No results found for: PROLACTIN Lab Results  Component Value Date   CHOL 144  04/22/2020   TRIG 75 04/22/2020   HDL 45 04/22/2020   CHOLHDL 3.2 04/22/2020   VLDL 15 04/22/2020   LDLCALC 84 04/22/2020   LDLCALC 81 04/22/2020    Current Medications: Current Facility-Administered Medications  Medication Dose Route Frequency Provider Last Rate Last Admin  . acetaminophen (TYLENOL) tablet 650 mg  650 mg Oral Q6H PRN Clapacs, John T, MD      . alum & mag hydroxide-simeth (MAALOX/MYLANTA) 200-200-20 MG/5ML suspension 30 mL  30 mL Oral Q4H PRN Clapacs, John T, MD      . citalopram (CELEXA) tablet 20 mg  20 mg Oral Daily Jesse Sans, MD      . hydrOXYzine (ATARAX/VISTARIL) tablet 50 mg  50 mg Oral Q6H PRN Clapacs, Jackquline Denmark, MD   50 mg at 04/23/20 0804  . magnesium hydroxide (MILK OF MAGNESIA) suspension 30 mL  30 mL Oral Daily PRN Clapacs, Jackquline Denmark, MD       PTA Medications: Medications Prior to Admission  Medication Sig Dispense Refill Last Dose  . aspirin-acetaminophen-caffeine (EXCEDRIN MIGRAINE) 250-250-65 MG tablet Take by mouth every 6 (six) hours as needed for headache.     . dicyclomine (BENTYL) 10 MG capsule Take 1 capsule (10 mg total) by mouth 4 (four) times daily -  before meals and at bedtime. As needed for cramping abdominal pain (Patient not taking: Reported on 04/22/2020) 30 capsule 0   . ibuprofen (ADVIL,MOTRIN) 200 MG tablet Take 200 mg by mouth every 6 (six) hours as needed.     . ondansetron (ZOFRAN ODT) 4 MG disintegrating tablet Take 1 tablet (4 mg total) by mouth every 8 (eight) hours as needed for nausea or vomiting. (Patient not taking: Reported on 04/22/2020) 30 tablet 0     Musculoskeletal: Strength & Muscle Tone: within normal limits Gait & Station: normal Patient leans: N/A  Psychiatric Specialty Exam: Physical Exam Vitals and nursing note reviewed.  Constitutional:      Appearance: Normal appearance.  HENT:     Head: Normocephalic and atraumatic.     Right Ear: External ear normal.     Left Ear: External ear normal.     Nose: Nose  normal.     Mouth/Throat:     Mouth: Mucous membranes are moist.     Pharynx: Oropharynx is clear.  Eyes:     Extraocular Movements: Extraocular movements intact.     Conjunctiva/sclera: Conjunctivae normal.     Pupils: Pupils are equal, round, and reactive to light.  Cardiovascular:     Rate and Rhythm: Normal rate.     Pulses: Normal pulses.  Pulmonary:     Effort: Pulmonary effort is normal.     Breath sounds: Normal breath sounds.  Abdominal:     General: Abdomen is flat.     Palpations: Abdomen is soft.  Musculoskeletal:        General: No swelling. Normal range of motion.     Cervical back: Normal range of motion and neck supple.  Skin:    General: Skin is warm and dry.  Neurological:  General: No focal deficit present.     Mental Status: He is alert and oriented to person, place, and time.  Psychiatric:        Attention and Perception: Attention and perception normal.        Mood and Affect: Mood is anxious.        Speech: Speech normal.        Behavior: Behavior is cooperative.        Thought Content: Thought content does not include homicidal or suicidal ideation.        Cognition and Memory: Cognition and memory normal.        Judgment: Judgment is impulsive.     Review of Systems  Constitutional: Negative for chills and fatigue.  HENT: Negative for rhinorrhea and sore throat.   Eyes: Negative for photophobia and visual disturbance.  Respiratory: Negative for cough and shortness of breath.   Cardiovascular: Negative for chest pain and palpitations.  Gastrointestinal: Negative for constipation, diarrhea, nausea and vomiting.  Endocrine: Negative for cold intolerance and heat intolerance.  Genitourinary: Negative for difficulty urinating and dysuria.  Musculoskeletal: Negative for arthralgias and myalgias.  Skin: Negative for rash and wound.  Allergic/Immunologic: Negative for environmental allergies and food allergies.  Neurological: Negative for dizziness  and headaches.  Hematological: Negative for adenopathy. Does not bruise/bleed easily.  Psychiatric/Behavioral: Negative for dysphoric mood, hallucinations, sleep disturbance and suicidal ideas. The patient is nervous/anxious.     Blood pressure (!) 143/83, pulse 100, temperature 97.9 F (36.6 C), temperature source Oral, resp. rate 18, height  (1.727 m), weight 55.3 kg, SpO2 100 %.Body mass index is 18.55 kg/m.  General Appearance: Casual and Fairly Groomed  Eye Contact:  Good  Speech:  Normal Rate  Volume:  Normal  Mood:  Anxious  Affect:  Congruent  Thought Process:  Coherent and Linear  Orientation:  Full (Time, Place, and Person)  Thought Content:  Logical  Suicidal Thoughts:  No  Homicidal Thoughts:  No  Memory:  Immediate;   Fair Recent;   Fair Remote;   Fair  Judgement:  Intact  Insight:  Shallow  Psychomotor Activity:  Normal  Concentration:  Concentration: Good  Recall:  Good  Fund of Knowledge:  Fair  Language:  Fair  Akathisia:  Negative  Handed:  Right  AIMS (if indicated):     Assets:  Communication Skills Desire for Improvement Housing Physical Health Resilience Social Support Transportation Vocational/Educational  ADL's:  Intact  Cognition:  WNL  Sleep:  Number of Hours: 4    Treatment Plan Summary: Daily contact with patient to assess and evaluate symptoms and progress in treatment and Medication management Patient is a 35 year old male presening with depression with mixed features. He endorses hopelessness, previous suicide attempts, and poor mood. He also endorses impuslive activities such as starting to drive across country. Mood disorder questionnaire completed and negative for bipolar disorder. Will start Citalopram 20 mg daily. Father confirms no weapons in the household.   Observation Level/Precautions:  15 minute checks  Laboratory:  Completed in ED  Psychotherapy:    Medications:    Consultations:    Discharge Concerns:    Estimated  LOS:  Other:     Physician Treatment Plan for Primary Diagnosis: MDD (major depressive disorder), single episode, severe , no psychosis (HCC) Long Term Goal(s): Improvement in symptoms so as ready for discharge  Short Term Goals: Ability to identify changes in lifestyle to reduce recurrence of condition will improve, Ability to verbalize feelings  will improve, Ability to disclose and discuss suicidal ideas, Ability to demonstrate self-control will improve, Ability to identify and develop effective coping behaviors will improve and Compliance with prescribed medications will improve  Physician Treatment Plan for Secondary Diagnosis: Principal Problem:   MDD (major depressive disorder), single episode, severe , no psychosis (HCC)  Long Term Goal(s): Improvement in symptoms so as ready for discharge  Short Term Goals: Ability to identify changes in lifestyle to reduce recurrence of condition will improve, Ability to verbalize feelings will improve, Ability to disclose and discuss suicidal ideas, Ability to demonstrate self-control will improve, Ability to identify and develop effective coping behaviors will improve, Ability to maintain clinical measurements within normal limits will improve and Compliance with prescribed medications will improve  I certify that inpatient services furnished can reasonably be expected to improve the patient's condition.    Jesse Sans, MD 2/17/202211:37 AM

## 2020-04-23 NOTE — [facility] Suicide Risk Assessment (Signed)
[facility] INPATIENT:  Family/Significant Other Suicide Prevention Education  Suicide Prevention Education:  Patient Refusal for Family/Significant Other Suicide Prevention Education: The patient Thomas Burke has refused to provide written consent for family/significant other to be provided Family/Significant Other Suicide Prevention Education during admission and/or prior to discharge.  Physician notified.  SPE completed with pt, as pt refused to consent to family contact. SPI pamphlet provided to pt and pt was encouraged to share information with support network, ask questions, and talk about any concerns relating to SPE. Pt denies access to guns/firearms and verbalized understanding of information provided. Mobile Crisis information also provided to pt.   Harden Mo 04/23/2020, 10:24 AM

## 2020-04-23 NOTE — [facility] Counselor (Signed)
CSW spoke with the patient regarding aftercare and patient declined aftercare at this time.  Penni Homans, MSW, LCSW 04/23/2020 10:26 AM

## 2020-04-23 NOTE — [facility] Group Notes (Signed)
LCSW Group Therapy Note     04/23/2020 2:10 PM     Type of Therapy/Topic:  Group Therapy:  Balance in Life     Participation Level:  Did Not Attend     Description of Group:    This group will address the concept of balance and how it feels and looks when one is unbalanced. Patients will be encouraged to process areas in their lives that are out of balance and identify reasons for remaining unbalanced. Facilitators will guide patients in utilizing problem-solving interventions to address and correct the stressor making their life unbalanced. Understanding and applying boundaries will be explored and addressed for obtaining and maintaining a balanced life. Patients will be encouraged to explore ways to assertively make their unbalanced needs known to significant others in their lives, using other group members and facilitator for support and feedback.     Therapeutic Goals:  1.      Patient will identify two or more emotions or situations they have that consume much of in their lives.  2.      Patient will identify signs/triggers that life has become out of balance:  3.      Patient will identify two ways to set boundaries in order to achieve balance in their lives:  4.      Patient will demonstrate ability to communicate their needs through discussion and/or role plays     Summary of Patient Progress: X   Therapeutic Modalities:   Cognitive Behavioral Therapy  Solution-Focused Therapy  Assertiveness Training     Retha Bither [place] MSW, LCSW-A  04/23/2020 2:10 PM

## 2020-04-23 NOTE — [facility] Suicide Risk Assessment (Signed)
[facility] Admission Suicide Risk Assessment   Nursing information obtained from:  Patient Demographic factors:  Male,Caucasian Current Mental Status:  NA Loss Factors:  Loss of significant relationship Historical Factors:  Prior suicide attempts Risk Reduction Factors:  Responsible for children under 37 years of age,Living with another person, especially a relative  Total Time spent with patient: 1 hour Principal Problem: MDD (major depressive disorder), single episode, severe , no psychosis (HCC) Diagnosis:  Principal Problem:   MDD (major depressive disorder), single episode, severe , no psychosis (HCC)  Subjective Data: 35 year old male who presented to the emergency room stating he was suicidal. No acute events overnight, attending to ADLs.  Patient seen one-on-one today. He notes that he had several life stressors happening to him recently. He is separated from his wife, and going through a divorce. They separated in October, and then moved in with his girlfriend from work in December. She has subsequently broken up with him over Valentine's Day, and he became increasingly depressed. He does bring up impulsively thinking about driving to [place] and then [place], but stopped himself. He mentions taking BC powders last week in a suicide attempt, but forced himself to vomit afterward. He does not bring up other suicide attempts with this provider, but per chart review he also mentioned trying to throw himself in front of a truck in the emergency room. He also mentioned to ED provider about trying cocaine three weeks ago to try an induce a heart attack. Patient currently denies suicidal ideations, homicidal ideations, visual hallucinations, or auditory hallucinations. He states he has a lot to live for including his children ages 80 and 32. He recently purchased a new house, and is starting a new job on Monday. He is also temporarily staying with his father until the house in ready. Given concerns about  impulsive decision to drive across country, we did perform a mood disorder questionnaire. This screening was negative for bipolar disorder. He requested to be started on Citalopram which worked well for his friend and ex-wife for depression. He also requests that we call his father and ex-wife.   Called Wylan Gentzler, father, [phone]: Father reports that [place] sounded like his usual self on the phone today. He does note that his son is going through hard times, and at times hides his feelings. Father reports that [place] did see a counselor as a teenager while his and his wife underwent a divorce. He confirms there are no guns in the household, and does feel safe with Kazi returning home. He also notes that he will keep an eye on his son.   Called wife from who he is separated,  Delford Wingert, [phone]: She reports that Reginold has suffered from depression for awhile, but she could never convince him to seek professional help. She does note that he has several good things happening in his life that should make him want to live such as new job, kids, and new house. She feels this episode was triggered from fight with his girlfriend. She also feels that depression runs in his family.   Continued Clinical Symptoms:  Alcohol Use Disorder Identification Test Final Score (AUDIT): 4 The "Alcohol Use Disorders Identification Test", Guidelines for Use in Primary Care, Second Edition.  World Science writer [facility]). Score between 0-7:  no or low risk or alcohol related problems. Score between 8-15:  moderate risk of alcohol related problems. Score between 16-19:  high risk of alcohol related problems. Score 20 or above:  warrants further  diagnostic evaluation for alcohol dependence and treatment.   CLINICAL FACTORS:   Severe Anxiety and/or Agitation Depression:   Recent sense of peace/wellbeing Previous Psychiatric Diagnoses and Treatments   Musculoskeletal: Strength & Muscle Tone:  within normal limits Gait & Station: normal Patient leans: N/A  Psychiatric Specialty Exam: Physical Exam Vitals and nursing note reviewed.  Constitutional:      Appearance: Normal appearance.  HENT:     Head: Normocephalic and atraumatic.     Right Ear: External ear normal.     Left Ear: External ear normal.     Nose: Nose normal.     Mouth/Throat:     Mouth: Mucous membranes are moist.     Pharynx: Oropharynx is clear.  Eyes:     Extraocular Movements: Extraocular movements intact.     Conjunctiva/sclera: Conjunctivae normal.     Pupils: Pupils are equal, round, and reactive to light.  Cardiovascular:     Rate and Rhythm: Normal rate.     Pulses: Normal pulses.  Pulmonary:     Effort: Pulmonary effort is normal.     Breath sounds: Normal breath sounds.  Abdominal:     General: Abdomen is flat.     Palpations: Abdomen is soft.  Musculoskeletal:        General: No swelling. Normal range of motion.     Cervical back: Normal range of motion and neck supple.  Skin:    General: Skin is warm and dry.  Neurological:     General: No focal deficit present.     Mental Status: He is alert and oriented to person, place, and time.  Psychiatric:        Attention and Perception: Attention and perception normal.        Mood and Affect: Mood is anxious.        Speech: Speech normal.        Behavior: Behavior is cooperative.        Thought Content: Thought content does not include homicidal or suicidal ideation.        Cognition and Memory: Cognition and memory normal.        Judgment: Judgment is impulsive.     Review of Systems  Constitutional: Negative for chills and fatigue.  HENT: Negative for rhinorrhea and sore throat.   Eyes: Negative for photophobia and visual disturbance.  Respiratory: Negative for cough and shortness of breath.   Cardiovascular: Negative for chest pain and palpitations.  Gastrointestinal: Negative for constipation, diarrhea, nausea and vomiting.   Endocrine: Negative for cold intolerance and heat intolerance.  Genitourinary: Negative for difficulty urinating and dysuria.  Musculoskeletal: Negative for arthralgias and myalgias.  Skin: Negative for rash and wound.  Allergic/Immunologic: Negative for environmental allergies and food allergies.  Neurological: Negative for dizziness and headaches.  Hematological: Negative for adenopathy. Does not bruise/bleed easily.  Psychiatric/Behavioral: Negative for dysphoric mood, hallucinations, sleep disturbance and suicidal ideas. The patient is nervous/anxious.     Blood pressure (!) 143/83, pulse 100, temperature 97.9 F (36.6 C), temperature source Oral, resp. rate 18, height 5\' 8"  (1.727 m), weight 55.3 kg, SpO2 100 %.Body mass index is 18.55 kg/m.  General Appearance: Casual and Fairly Groomed  Eye Contact:  Good  Speech:  Normal Rate  Volume:  Normal  Mood:  Anxious  Affect:  Congruent  Thought Process:  Coherent and Linear  Orientation:  Full (Time, Place, and Person)  Thought Content:  Logical  Suicidal Thoughts:  No  Homicidal Thoughts:  No  Memory:  Immediate;   Fair Recent;   Fair Remote;   Fair  Judgement:  Intact  Insight:  Shallow  Psychomotor Activity:  Normal  Concentration:  Concentration: Good  Recall:  Good  Fund of Knowledge:  Fair  Language:  Fair  Akathisia:  Negative  Handed:  Right  AIMS (if indicated):     Assets:  Communication Skills Desire for Improvement Housing Physical Health Resilience Social Support Transportation Vocational/Educational  ADL's:  Intact  Cognition:  WNL  Sleep:  Number of Hours: 4         COGNITIVE FEATURES THAT CONTRIBUTE TO RISK:  None    SUICIDE RISK:   Mild:  Suicidal ideation of limited frequency, intensity, duration, and specificity.  There are no identifiable plans, no associated intent, mild dysphoria and related symptoms, good self-control (both objective and subjective assessment), few other risk factors,  and identifiable protective factors, including available and accessible social support.  PLAN OF CARE: Continue admission. See H&P for full assessment and plan.   I certify that inpatient services furnished can reasonably be expected to improve the patient's condition.   Jesse Sans, MD 04/23/2020, 11:36 AM

## 2020-04-23 NOTE — Progress Notes (Signed)
Recreation Therapy Notes  INPATIENT RECREATION TR PLAN  Patient Details Name: Thomas Burke MRN: [id number] DOB: 09-21-1985 Today's Date: 04/23/2020  Rec Therapy Plan Is patient appropriate for Therapeutic Recreation?: Yes Treatment times per week: at least 3 Estimated Length of Stay: 5-7 days TR Treatment/Interventions: Group participation (Comment)  Discharge Criteria Pt will be discharged from therapy if:: Discharged Treatment plan/goals/alternatives discussed and agreed upon by:: Patient/family  Discharge Summary     [name] 04/23/2020, 4:26 PM

## 2020-04-24 MED ORDER — CITALOPRAM HYDROBROMIDE 20 MG PO TABS: 20.0000 mg | ORAL_TABLET | Freq: Every day | ORAL | 1 refills | Status: DC

## 2020-04-24 NOTE — BHH Counselor (Addendum)
CSW spoke with pt regarding discharge briefly. Pt stated that his vehicle in the the parking lot and that he has clothes to go home in. CSW inquired if pt would like to have an appointment made for outpatient treatment prior to discharge. He stated that he would make his own appointment later today when he can get on the Internet and look at providers on his own. Patient states that his shoes, keys, and phone are in lock up. CSW explained that nursing will give him his things when it is time for discharge and that they would let him know when they have everything ready. He agreed. Pt was given list of providers who accept his insurance. No other concerns expressed. Contact ended without incident.   Vilma Meckel. Algis Greenhouse, MSW, LCSW, LCAS 04/24/2020 9:34 AM

## 2020-04-24 NOTE — Progress Notes (Signed)
Patient discharged to his father's house. Reports that his father and step-mother are supportive. He is returning to work [employer]. He is knowledgeable of his medication regimen and does not need help to make a follow-up appointment. Denies SI/HI upon discharge.

## 2020-04-24 NOTE — Plan of Care (Signed)
Cooperative and calm. Denies SI/HI/AVH. Feeling more hopefull and ready to move on. Report that his appetite is improving, that he is sleeping better. No sign of distress.

## 2020-04-24 NOTE — Discharge Summary (Signed)
Physician Discharge Summary Note  Patient:  Thomas Burke is an 35 y.o., male MRN:  [id number] DOB:  01/10/1986 Patient phone:  [phone] (home)  Patient address:   [address] [place] [address],  Total Time spent with patient: 35 minutes- 25 minutes direct patient care, 10 minutes documentation, coordination of care, and prescriptions  Date of Admission:  04/22/2020 Date of Discharge: 04/24/2020  Reason for Admission:  35 year old male who presented to the emergency room stating he was suicidal  Principal Problem: MDD (major depressive disorder), single episode, severe , no psychosis (HCC) Discharge Diagnoses: Principal Problem:   MDD (major depressive disorder), single episode, severe , no psychosis (HCC)   Past Psychiatric History: Patient saw a therapist as a teenager during his parents divorce. No history of hospitilization or previous drug trials. Patient admits to suicide attempts prior to hospital stay.   Past Medical History:  Past Medical History:  Diagnosis Date  . Depression   . Migraine   . Persistent headaches   . Seasonal allergies     Past Surgical History:  Procedure Laterality Date  . none     Family History:  Family History  Problem Relation Age of Onset  . Depression Mother    Family Psychiatric  History: Patient denies history of mental illness, substance abuse, or suicide attempts in the family. Per chart review, mother with depression Social History:  Social History   Substance and Sexual Activity  Alcohol Use Yes  . Alcohol/week: 2.0 standard drinks  . Types: 2 Cans of beer per week     Social History   Substance and Sexual Activity  Drug Use No    Social History   Socioeconomic History  . Marital status: Legally Separated    Spouse name: Not on file  . Number of children: Not on file  . Years of education: Not on file  . Highest education level: Not on file  Occupational History  . Not on file  Tobacco Use  .  Smoking status: Current Every Day Smoker    Packs/day: 1.50    Years: 12.00    Pack years: 18.00    Types: Cigarettes  . Smokeless tobacco: Never Used  Vaping Use  . Vaping Use: Never used  Substance and Sexual Activity  . Alcohol use: Yes    Alcohol/week: 2.0 standard drinks    Types: 2 Cans of beer per week  . Drug use: No  . Sexual activity: Not Currently  Other Topics Concern  . Not on file  Social History Narrative  . Not on file   Social Determinants of Health   Financial Resource Strain: Not on file  Food Insecurity: Not on file  Transportation Needs: Not on file  Physical Activity: Not on file  Stress: Not on file  Social Connections: Not on file    Hospital Course:  35 year old male who presented to the emergency room stating he was suicidal. On admission he was assessed for bipolar disorder via Mood Disorder Questionnaire which was a negative screening. He endorsed several symptoms of depression. He was started on Celexa 20 mg, and tolerated well without incident. His father and ex-wife were contacted prior to discharge, and both felt he was safe to return home. Verified there were no guns in the household with father. He will be living with his father at discharge until his new home is ready. Outpatient resources provided for psychiatrists and therapists near [place] that accept his insurance. At time of discharge  he denied suicidal ideations, homicidal ideations, visual hallucinations, and auditory hallucinations.   Physical Findings: AIMS: Facial and Oral Movements Muscles of Facial Expression: None, normal Lips and Perioral Area: None, normal Jaw: None, normal Tongue: None, normal,Extremity Movements Upper (arms, wrists, hands, fingers): None, normal Lower (legs, knees, ankles, toes): None, normal, Trunk Movements Neck, shoulders, hips: None, normal, Overall Severity Severity of abnormal movements (highest score from questions above): None, normal Incapacitation  due to abnormal movements: None, normal Patient's awareness of abnormal movements (rate only patient's report): No Awareness, Dental Status Current problems with teeth and/or dentures?: Yes Does patient usually wear dentures?: Yes (upper dentures)  CIWA:  CIWA-Ar Total: 0 COWS:  COWS Total Score: 1  Musculoskeletal: Strength & Muscle Tone: within normal limits Gait & Station: normal Patient leans: N/A  Psychiatric Specialty Exam: Physical Exam Vitals and nursing note reviewed.  Constitutional:      Appearance: Normal appearance.  HENT:     Head: Normocephalic and atraumatic.     Right Ear: External ear normal.     Left Ear: External ear normal.     Nose: Nose normal.     Mouth/Throat:     Mouth: Mucous membranes are moist.     Pharynx: Oropharynx is clear.  Eyes:     Extraocular Movements: Extraocular movements intact.     Conjunctiva/sclera: Conjunctivae normal.     Pupils: Pupils are equal, round, and reactive to light.  Cardiovascular:     Rate and Rhythm: Normal rate.     Pulses: Normal pulses.  Pulmonary:     Effort: Pulmonary effort is normal.     Breath sounds: Normal breath sounds.  Abdominal:     General: Abdomen is flat.     Palpations: Abdomen is soft.  Musculoskeletal:        General: No swelling. Normal range of motion.     Cervical back: Normal range of motion and neck supple.  Skin:    General: Skin is warm and dry.  Neurological:     General: No focal deficit present.     Mental Status: He is alert and oriented to person, place, and time.  Psychiatric:        Attention and Perception: Attention and perception normal.        Mood and Affect: Mood is anxious.        Speech: Speech normal.        Behavior: Behavior is cooperative.        Thought Content: Thought content does not include homicidal or suicidal ideation.        Cognition and Memory: Cognition and memory normal.        Judgment: Judgment is impulsive.     Review of Systems   Constitutional: Negative for chills and fatigue.  HENT: Negative for rhinorrhea and sore throat.   Eyes: Negative for photophobia and visual disturbance.  Respiratory: Negative for cough and shortness of breath.   Cardiovascular: Negative for chest pain and palpitations.  Gastrointestinal: Negative for constipation, diarrhea, nausea and vomiting.  Endocrine: Negative for cold intolerance and heat intolerance.  Genitourinary: Negative for difficulty urinating and dysuria.  Musculoskeletal: Negative for arthralgias and myalgias.  Skin: Negative for rash and wound.  Allergic/Immunologic: Negative for environmental allergies and food allergies.  Neurological: Negative for dizziness and headaches.  Hematological: Negative for adenopathy. Does not bruise/bleed easily.  Psychiatric/Behavioral: Negative for dysphoric mood, hallucinations, sleep disturbance and suicidal ideas. The patient is nervous/anxious.     Blood pressure Marland Kitchen)  143/83, pulse 100, temperature 97.9 F (36.6 C), temperature source Oral, resp. rate 18, height 5\' 8"  (1.727 m), weight 55.3 kg, SpO2 100 %.Body mass index is 18.55 kg/m.  General Appearance: Casual and Fairly Groomed  Eye Contact:  Good  Speech:  Normal Rate  Volume:  Normal  Mood:  Anxious  Affect:  Congruent  Thought Process:  Coherent and Linear  Orientation:  Full (Time, Place, and Person)  Thought Content:  Logical  Suicidal Thoughts:  No  Homicidal Thoughts:  No  Memory:  Immediate;   Fair Recent;   Fair Remote;   Fair  Judgement:  Intact  Insight:  Shallow  Psychomotor Activity:  Normal  Concentration:  Concentration: Good  Recall:  Good  Fund of Knowledge:  Fair  Language:  Fair  Akathisia:  Negative  Handed:  Right  AIMS (if indicated):     Assets:  Communication Skills Desire for Improvement Housing Physical Health Resilience Social Support Transportation Vocational/Educational  ADL's:  Intact  Cognition:  WNL  Sleep:  Number of  Hours: 4        Have you used any form of tobacco in the last 30 days? (Cigarettes, Smokeless Tobacco, Cigars, and/or Pipes): Yes  Has this patient used any form of tobacco in the last 30 days? (Cigarettes, Smokeless Tobacco, Cigars, and/or Pipes)  Yes, A prescription for an FDA-approved tobacco cessation medication was offered at discharge and the patient refused  Blood Alcohol level:  Lab Results  Component Value Date   [facility] <10 04/22/2020   ETH <10 09/23/2017    Metabolic Disorder Labs:  Lab Results  Component Value Date   HGBA1C 5.3 04/22/2020   MPG 105.41 04/22/2020   MPG 108.28 04/22/2020   No results found for: PROLACTIN Lab Results  Component Value Date   CHOL 144 04/22/2020   TRIG 75 04/22/2020   HDL 45 04/22/2020   CHOLHDL 3.2 04/22/2020   VLDL 15 04/22/2020   LDLCALC 84 04/22/2020   LDLCALC 81 04/22/2020    See Psychiatric Specialty Exam and Suicide Risk Assessment completed by Attending Physician prior to discharge.  Discharge destination:  Home  Is patient on multiple antipsychotic therapies at discharge:  No   Has Patient had three or more failed trials of antipsychotic monotherapy by history:  No  Recommended Plan for Multiple Antipsychotic Therapies: NA  Discharge Instructions    Diet general   Complete by: As directed    Increase activity slowly   Complete by: As directed      Allergies as of 04/24/2020   No Known Allergies     Medication List    STOP taking these medications   aspirin-acetaminophen-caffeine 250-250-65 MG tablet Commonly known as: EXCEDRIN MIGRAINE   dicyclomine 10 MG capsule Commonly known as: BENTYL   ibuprofen 200 MG tablet Commonly known as: ADVIL   ondansetron 4 MG disintegrating tablet Commonly known as: Zofran ODT     TAKE these medications     Indication  citalopram 20 MG tablet Commonly known as: CELEXA Take 1 tablet (20 mg total) by mouth daily. Start taking on: April 25, 2020  Indication:  Depression        Follow-up recommendations:  Activity:  as tolerated Diet:  regular diet  Comments:  30-day script with one refill sent to [facility] in [place], [place] per patient request  Signed: [place], MD 04/24/2020, 9:11 AM

## 2020-04-24 NOTE — Progress Notes (Signed)
Recreation Therapy Notes  INPATIENT RECREATION TR PLAN  Patient Details Name: Thomas Burke MRN: [id number] DOB: October 22, 1985 Today's Date: 04/24/2020  Rec Therapy Plan Is patient appropriate for Therapeutic Recreation?: Yes Treatment times per week: at least 3 Estimated Length of Stay: 5-7 days TR Treatment/Interventions: Group participation (Comment)  Discharge Criteria Pt will be discharged from therapy if:: Discharged Treatment plan/goals/alternatives discussed and agreed upon by:: Patient/family  Discharge Summary Short term goals set: Patient will engage in groups without prompting or encouragement from LRT x3 group sessions within 5 recreation therapy group sessions Short term goals met: Not met Reason goals not met: Patient did not attend any groups Therapeutic equipment acquired: N/A Reason patient discharged from therapy: Discharge from hospital Pt/family agrees with progress & goals achieved: Yes Date patient discharged from therapy: 04/24/20   Kreed Kauffman 04/24/2020, 11:19 AM

## 2020-04-24 NOTE — Plan of Care (Signed)
  Problem: Group Participation Goal: STG - Patient will engage in groups without prompting or encouragement from LRT x3 group sessions within 5 recreation therapy group sessions Description: STG - Patient will engage in groups without prompting or encouragement from LRT x3 group sessions within 5 recreation therapy group sessions 04/24/2020 [id number] by Ernest Haber, LRT Outcome: Not Applicable 08/19/7090 [id number] by Ernest Haber, LRT Outcome: Not Met (add Reason) Note: Patient did not attend any groups.

## 2020-04-24 NOTE — [facility] Suicide Risk Assessment (Signed)
[facility] Discharge Suicide Risk Assessment   Principal Problem: MDD (major depressive disorder), single episode, severe , no psychosis (HCC) Discharge Diagnoses: Principal Problem:   MDD (major depressive disorder), single episode, severe , no psychosis (HCC)   Total Time spent with patient: 35 minutes- 25 minutes direct patient care, 10 minutes documentation, coordination of care, and prescriptions  Musculoskeletal: Strength & Muscle Tone: within normal limits Gait & Station: normal Patient leans: N/A  Psychiatric Specialty Exam: Review of Systems  Constitutional: Negative for activity change and fatigue.  HENT: Negative for rhinorrhea and sore throat.   Eyes: Negative for photophobia and visual disturbance.  Respiratory: Negative for cough and shortness of breath.   Cardiovascular: Negative for chest pain and palpitations.  Gastrointestinal: Negative for constipation, diarrhea, nausea and vomiting.  Endocrine: Negative for cold intolerance and heat intolerance.  Genitourinary: Negative for difficulty urinating and dysuria.  Musculoskeletal: Negative for arthralgias and myalgias.  Skin: Negative for rash and wound.  Allergic/Immunologic: Negative for environmental allergies and food allergies.  Neurological: Negative for dizziness and headaches.  Hematological: Negative for adenopathy. Does not bruise/bleed easily.  Psychiatric/Behavioral: Negative for decreased concentration, dysphoric mood, hallucinations, sleep disturbance and suicidal ideas.    Blood pressure 127/89, pulse 75, temperature 98.5 F (36.9 C), temperature source Oral, resp. rate 17, height 5\' 8"  (1.727 m), weight 55.3 kg, SpO2 98 %.Body mass index is 18.55 kg/m.  General Appearance: Fairly Groomed  ::  Good  Speech:  Clear and Coherent and Normal Rate  Volume:  Normal  Mood:  Euthymic  Affect:  Congruent  Thought Process:  Coherent and Linear  Orientation:  Full (Time, Place, and Person)  Thought  Content:  Logical  Suicidal Thoughts:  No  Homicidal Thoughts:  No  Memory:  Immediate;   Fair Recent;   Fair Remote;   Fair  Judgement:  Fair  Insight:  Fair  Psychomotor Activity:  Normal  Concentration:  Fair  Recall:  [id number] of Knowledge:Fair  Language: Fair  Akathisia:  Negative  Handed:  Right  AIMS (if indicated):     Assets:  Communication Skills Desire for Improvement Financial Resources/Insurance Housing Leisure Time Physical Health Resilience Social Support Transportation Vocational/Educational  Sleep:  Number of Hours: 4.75  Cognition: WNL  ADL's:  Intact   Mental Status Per Nursing Assessment::   On Admission:  NA  Demographic Factors:  Male, Divorced or widowed and Caucasian  Loss Factors: NA  Historical Factors: Prior suicide attempts  Risk Reduction Factors:   Responsible for children under 42 years of age, Sense of responsibility to family, Employed, Living with another person, especially a relative, Positive social support, Positive therapeutic relationship and Positive coping skills or problem solving skills  Continued Clinical Symptoms:  Depression:   Recent sense of peace/wellbeing Previous Psychiatric Diagnoses and Treatments  Cognitive Features That Contribute To Risk:  None    Suicide Risk:  Mild:  Suicidal ideation of limited frequency, intensity, duration, and specificity.  There are no identifiable plans, no associated intent, mild dysphoria and related symptoms, good self-control (both objective and subjective assessment), few other risk factors, and identifiable protective factors, including available and accessible social support.    Plan Of Care/Follow-up recommendations:  Activity:  as tolerated Diet:  regular diet  15, MD 04/24/2020, 9:06 AM

## 2020-04-24 NOTE — Progress Notes (Signed)
Recreation Therapy Notes  Date: 04/24/2020  Time: 9:30 am   Location: Craft room     Behavioral response: N/A   Intervention Topic: Leisure    Discussion/Intervention: Patient did not attend group.   Clinical Observations/Feedback:  Patient did not attend group.   Kylen Schliep LRT/CTRS         Deakon Frix 04/24/2020 10:44 AM

## 2020-04-24 NOTE — Tx Team (Addendum)
Interdisciplinary Treatment and Diagnostic Plan Update  04/24/2020 Time of Session: 09:00 Thomas Burke MRN: [id number]  Principal Diagnosis: MDD (major depressive disorder), single episode, severe , no psychosis (HCC)  Secondary Diagnoses: Principal Problem:   MDD (major depressive disorder), single episode, severe , no psychosis (HCC)   Current Medications:  Current Facility-Administered Medications  Medication Dose Route Frequency Provider Last Rate Last Admin  . acetaminophen (TYLENOL) tablet 650 mg  650 mg Oral Q6H PRN Clapacs, Jackquline Denmark, MD   650 mg at 04/24/20 0421  . alum & mag hydroxide-simeth (MAALOX/MYLANTA) 200-200-20 MG/5ML suspension 30 mL  30 mL Oral Q4H PRN Clapacs, John T, MD      . citalopram (CELEXA) tablet 20 mg  20 mg Oral Daily Jesse Sans, MD   20 mg at 04/24/20 0754  . hydrOXYzine (ATARAX/VISTARIL) tablet 50 mg  50 mg Oral Q6H PRN Clapacs, Jackquline Denmark, MD   50 mg at 04/24/20 0421  . magnesium hydroxide (MILK OF MAGNESIA) suspension 30 mL  30 mL Oral Daily PRN Clapacs, Jackquline Denmark, MD       PTA Medications: Medications Prior to Admission  Medication Sig Dispense Refill Last Dose  . aspirin-acetaminophen-caffeine (EXCEDRIN MIGRAINE) 250-250-65 MG tablet Take by mouth every 6 (six) hours as needed for headache.     . dicyclomine (BENTYL) 10 MG capsule Take 1 capsule (10 mg total) by mouth 4 (four) times daily -  before meals and at bedtime. As needed for cramping abdominal pain (Patient not taking: Reported on 04/22/2020) 30 capsule 0   . ibuprofen (ADVIL,MOTRIN) 200 MG tablet Take 200 mg by mouth every 6 (six) hours as needed.     . ondansetron (ZOFRAN ODT) 4 MG disintegrating tablet Take 1 tablet (4 mg total) by mouth every 8 (eight) hours as needed for nausea or vomiting. (Patient not taking: Reported on 04/22/2020) 30 tablet 0     Patient Stressors: Marital or family conflict Medication change or noncompliance  Patient Strengths: Motivation for  treatment/growth Supportive family/friends  Treatment Modalities: Medication Management, Group therapy, Case management,  1 to 1 session with clinician, Psychoeducation, Recreational therapy.   Physician Treatment Plan for Primary Diagnosis: MDD (major depressive disorder), single episode, severe , no psychosis (HCC) Long Term Goal(s): Improvement in symptoms so as ready for discharge Improvement in symptoms so as ready for discharge   Short Term Goals: Ability to identify changes in lifestyle to reduce recurrence of condition will improve Ability to verbalize feelings will improve Ability to disclose and discuss suicidal ideas Ability to demonstrate self-control will improve Ability to identify and develop effective coping behaviors will improve Compliance with prescribed medications will improve Ability to identify changes in lifestyle to reduce recurrence of condition will improve Ability to verbalize feelings will improve Ability to disclose and discuss suicidal ideas Ability to demonstrate self-control will improve Ability to identify and develop effective coping behaviors will improve Ability to maintain clinical measurements within normal limits will improve Compliance with prescribed medications will improve  Medication Management: Evaluate patient's response, side effects, and tolerance of medication regimen.  Therapeutic Interventions: 1 to 1 sessions, Unit Group sessions and Medication administration.  Evaluation of Outcomes: Adequate for Discharge  Physician Treatment Plan for Secondary Diagnosis: Principal Problem:   MDD (major depressive disorder), single episode, severe , no psychosis (HCC)  Long Term Goal(s): Improvement in symptoms so as ready for discharge Improvement in symptoms so as ready for discharge   Short Term Goals: Ability to identify changes in  lifestyle to reduce recurrence of condition will improve Ability to verbalize feelings will  improve Ability to disclose and discuss suicidal ideas Ability to demonstrate self-control will improve Ability to identify and develop effective coping behaviors will improve Compliance with prescribed medications will improve Ability to identify changes in lifestyle to reduce recurrence of condition will improve Ability to verbalize feelings will improve Ability to disclose and discuss suicidal ideas Ability to demonstrate self-control will improve Ability to identify and develop effective coping behaviors will improve Ability to maintain clinical measurements within normal limits will improve Compliance with prescribed medications will improve     Medication Management: Evaluate patient's response, side effects, and tolerance of medication regimen.  Therapeutic Interventions: 1 to 1 sessions, Unit Group sessions and Medication administration.  Evaluation of Outcomes: Adequate for Discharge   RN Treatment Plan for Primary Diagnosis: MDD (major depressive disorder), single episode, severe , no psychosis (HCC) Long Term Goal(s): Knowledge of disease and therapeutic regimen to maintain health will improve  Short Term Goals: Ability to remain free from injury will improve, Ability to verbalize frustration and anger appropriately will improve, Ability to participate in decision making will improve, Ability to verbalize feelings will improve, Ability to disclose and discuss suicidal ideas, Ability to identify and develop effective coping behaviors will improve and Compliance with prescribed medications will improve  Medication Management: RN will administer medications as ordered by provider, will assess and evaluate patient's response and provide education to patient for prescribed medication. RN will report any adverse and/or side effects to prescribing provider.  Therapeutic Interventions: 1 on 1 counseling sessions, Psychoeducation, Medication administration, Evaluate responses to  treatment, Monitor vital signs and CBGs as ordered, Perform/monitor CIWA, COWS, AIMS and Fall Risk screenings as ordered, Perform wound care treatments as ordered.  Evaluation of Outcomes: Adequate for Discharge   LCSW Treatment Plan for Primary Diagnosis: MDD (major depressive disorder), single episode, severe , no psychosis (HCC) Long Term Goal(s): Safe transition to appropriate next level of care at discharge, Engage patient in therapeutic group addressing interpersonal concerns.  Short Term Goals: Engage patient in aftercare planning with referrals and resources, Increase social support, Identify triggers associated with mental health/substance abuse issues and Increase skills for wellness and recovery  Therapeutic Interventions: Assess for all discharge needs, 1 to 1 time with Social worker, Explore available resources and support systems, Assess for adequacy in community support network, Educate family and significant other(s) on suicide prevention, Complete Psychosocial Assessment, Interpersonal group therapy.  Evaluation of Outcomes: Adequate for Discharge   Progress in Treatment: Attending groups: No. Participating in groups: No. Taking medication as prescribed: Yes. Toleration medication: Yes. Family/Significant other contact made: No, will contact:  pt declined collateral/familial contact.  Patient understands diagnosis: Yes. Discussing patient identified problems/goals with staff: Yes. Medical problems stabilized or resolved: Yes. Denies suicidal/homicidal ideation: Yes. Issues/concerns per patient self-inventory: No. Other: None.  New problem(s) identified: No, Describe:  none.  New Short Term/Long Term Goal(s): medication management for mood stabilization; elimination of SI thoughts; development of comprehensive mental wellness plan.   Patient Goals: "Get back home...spend time with my kids."    Discharge Plan or Barriers: Pt plans to return home. He was given  resources for outpatient providers who take his insurance. CSW will follow up regarding whether pt would like an appointment scheduled.  Reason for Continuation of Hospitalization: Medication stabilization Suicidal ideation  Estimated Length of Stay: 1-2 days   Recreational Therapy: Patient Stressors: N/A Patient Goal: Patient will engage in groups  without prompting or encouragement from LRT x3 group sessions within 5 recreation therapy group sessions.  Attendees: Patient: Thomas Burke 04/24/2020 9:12 AM  Physician: Les Pou, MD 04/24/2020 9:12 AM  Nursing: Cecille Amsterdam, RN 04/24/2020 9:12 AM  RN Care Manager: 04/24/2020 9:12 AM  Social Worker: Vilma Meckel. Algis Greenhouse, MSW, LCSW, LCAS 04/24/2020 9:12 AM  Recreational Therapist: Hilbert Bible, LRT  04/24/2020 9:12 AM  Other:  04/24/2020 9:12 AM  Other:  04/24/2020 9:12 AM  Other: 04/24/2020 9:12 AM    Scribe for Treatment Team: Glenis Smoker, LCSW 04/24/2020 9:12 AM

## 2020-04-24 NOTE — Progress Notes (Addendum)
  [facility] Adult Case Management Discharge Plan :  Will you be returning to the same living situation after discharge:  Yes,  pt plans to return home. At discharge, do you have transportation home?: Yes,  pt vehicle in parking at this hospital. Do you have the ability to pay for your medications: Yes,  AETNA/AET[employer] NAP  Release of information consent forms completed and in the chart;  Patient's signature needed at discharge.  Patient to Follow up at: Follow-up Information    Pt declined scheduled aftercare Follow up.   Why: Pt stated that he will schedule his own appointment later. Pt was given list of providers who accept his insurance.       Next level of care provider has access to [facility] Link:no  Safety Planning and Suicide Prevention discussed: Yes,  SPE completed with pt as he declined collateral/familial contact.  Have you used any form of tobacco in the last 30 days? (Cigarettes, Smokeless Tobacco, Cigars, and/or Pipes): Yes  Has patient been referred to the Quitline?: Patient refused referral  Patient has been referred for addiction treatment: Pt. refused referral  Glenis Smoker, LCSW 04/24/2020, 9:35 AM

## 2020-06-24 DIAGNOSIS — F322 Major depressive disorder, single episode, severe without psychotic features: Secondary | ICD-10-CM

## 2020-06-25 MED ORDER — CITALOPRAM HYDROBROMIDE 20 MG PO TABS: 20.0000 mg | ORAL_TABLET | Freq: Every day | ORAL | 1 refills | Status: DC

## 2020-08-24 ENCOUNTER — Other Ambulatory Visit: Payer: Self-pay

## 2020-08-24 DIAGNOSIS — F322 Major depressive disorder, single episode, severe without psychotic features: Secondary | ICD-10-CM

## 2020-08-24 MED ORDER — CITALOPRAM HYDROBROMIDE 20 MG PO TABS: 20.0000 mg | ORAL_TABLET | Freq: Every day | ORAL | 1 refills | Status: DC

## 2020-11-19 ENCOUNTER — Other Ambulatory Visit: Payer: Self-pay | Admitting: Family Medicine

## 2020-11-19 DIAGNOSIS — F322 Major depressive disorder, single episode, severe without psychotic features: Secondary | ICD-10-CM

## 2020-11-19 NOTE — Telephone Encounter (Signed)
Requested medication (s) are due for refill today: yes  Requested medication (s) are on the active medication list: yes   Last refill: 08/24/20  #30  1 refill  Future visit scheduled  no  Notes to clinic: Needs appt. Attempted to reach "VM not set up." Please review.  Requested Prescriptions  Pending Prescriptions Disp Refills   citalopram (CELEXA) 20 MG tablet 30 tablet 1    Sig: Take 1 tablet (20 mg total) by mouth daily.     Psychiatry:  Antidepressants - SSRI Failed - 11/19/2020  5:47 PM      Failed - Valid encounter within last 6 months    Recent Outpatient Visits           10 months ago LLQ abdominal pain   [facility] [place], Netta Neat, DO   1 year ago Abnormal penile discharge   [facility] Smitty Cords, DO   2 years ago Urinary frequency   [facility] Smitty Cords, DO   3 years ago Urinary frequency   [facility] [place] Ky[place] Rudd, Alison Stalling, NP   5 years ago Dysuria   [facility] Laroy Apple, Laurel Dimmer, NP              Passed - Completed PHQ-2 or PHQ-9 in the last 360 days

## 2020-11-19 NOTE — Telephone Encounter (Signed)
Medication Refill - Medication:   citalopram (CELEXA) 20 MG tablet  Has the patient contacted their pharmacy? Yes.  Contact Dr Kirtland Bouchard for refills.   Preferred Pharmacy (with phone number or street name):   [facility] [address] [place] - [address]  [address] [place] [address]  Phone: [phone] Fax: [phone]    Agent: Please be advised that RX refills may take up to 3 business days. We ask that you follow-up with your pharmacy.

## 2020-11-20 ENCOUNTER — Other Ambulatory Visit: Payer: Self-pay | Admitting: Family Medicine

## 2020-11-20 DIAGNOSIS — F322 Major depressive disorder, single episode, severe without psychotic features: Secondary | ICD-10-CM

## 2020-11-20 NOTE — Telephone Encounter (Signed)
Requested medication (s) are due for refill today - yes  Requested medication (s) are on the active medication list -yes  Future visit scheduled -no  Last refill: 08/24/20 #30 1RF  Notes to clinic: Duplicate request- attempted to call patient again- no answer- VM not set up- sent to office for review   Requested Prescriptions  Pending Prescriptions Disp Refills   citalopram (CELEXA) 20 MG tablet [Pharmacy Med Name: Citalopram Hydrobromide 20 MG Oral Tablet] 30 tablet 0    Sig: Take 1 tablet by mouth once daily     Psychiatry:  Antidepressants - SSRI Failed - 11/20/2020  6:28 AM      Failed - Valid encounter within last 6 months    Recent Outpatient Visits           10 months ago LLQ abdominal pain   [facility] Smitty Cords, DO   1 year ago Abnormal penile discharge   [facility] Smitty Cords, DO   2 years ago Urinary frequency   [facility] [place], Netta Neat, DO   3 years ago Urinary frequency   [facility] Kyung Rudd, Alison Stalling, NP   5 years ago Dysuria   [facility] Laroy Apple, Laurel Dimmer, NP              Passed - Completed PHQ-2 or PHQ-9 in the last 360 days         Requested Prescriptions  Pending Prescriptions Disp Refills   citalopram (CELEXA) 20 MG tablet [Pharmacy Med Name: Citalopram Hydrobromide 20 MG Oral Tablet] 30 tablet 0    Sig: Take 1 tablet by mouth once daily     Psychiatry:  Antidepressants - SSRI Failed - 11/20/2020  6:28 AM      Failed - Valid encounter within last 6 months    Recent Outpatient Visits           10 months ago LLQ abdominal pain   [facility] [place], Netta Neat, DO   1 year ago Abnormal penile discharge   [facility] [place], Netta Neat, DO   2 years ago Urinary frequency   [facility] Smitty Cords, DO   3 years ago Urinary frequency   [facility] Kyung Rudd, Alison Stalling, NP   5 years ago Dysuria   [facility] Laroy Apple, Laurel Dimmer, NP              Passed - Completed PHQ-2 or PHQ-9 in the last 360 days

## 2020-11-23 MED ORDER — CITALOPRAM HYDROBROMIDE 20 MG PO TABS
20.0000 mg | ORAL_TABLET | Freq: Every day | ORAL | 0 refills | Status: DC
Start: 2020-11-23 — End: 2021-01-05

## 2021-01-05 ENCOUNTER — Other Ambulatory Visit: Payer: Self-pay | Admitting: Family Medicine

## 2021-01-05 DIAGNOSIS — F322 Major depressive disorder, single episode, severe without psychotic features: Secondary | ICD-10-CM

## 2021-01-05 NOTE — Telephone Encounter (Signed)
Requested medication (s) are due for refill today:   Yes  Requested medication (s) are on the active medication list:   Yes  Future visit scheduled:   No      Last ordered: 11/23/2020 #30, 0 refills with a note he needs an OV for further refills.  Returned because he has not returned our calls to make an appt.   I left a voicemail today for him to call in and make an appt.   Provider to review for refills.     Requested Prescriptions  Pending Prescriptions Disp Refills   citalopram (CELEXA) 20 MG tablet 30 tablet 0    Sig: Take 1 tablet (20 mg total) by mouth daily. Need to follow-up with doctors visit within 30 days for further refills     Psychiatry:  Antidepressants - SSRI Failed - 01/05/2021  1:23 PM      Failed - Valid encounter within last 6 months    Recent Outpatient Visits           11 months ago LLQ abdominal pain   [facility] [place], Netta Neat, DO   1 year ago Abnormal penile discharge   [facility] Smitty Cords, DO   2 years ago Urinary frequency   [facility] Smitty Cords, DO   3 years ago Urinary frequency   [facility] Galen Manila, NP   5 years ago Dysuria   [facility] Laroy Apple, Laurel Dimmer, NP              Passed - Completed PHQ-2 or PHQ-9 in the last 360 days

## 2021-01-05 NOTE — Telephone Encounter (Signed)
Pt called to report that he never picked up the last prescription that he received from PCP  [facility] [place], [place] - [address]  [address] [address]  Phone: [phone] Fax: [phone]  citalopram (CELEXA) 20 MG tablet

## 2021-01-06 MED ORDER — CITALOPRAM HYDROBROMIDE 20 MG PO TABS: 20.0000 mg | ORAL_TABLET | Freq: Every day | ORAL | 0 refills | Status: DC

## 2021-01-08 ENCOUNTER — Other Ambulatory Visit: Payer: Self-pay | Admitting: Family Medicine

## 2021-01-08 DIAGNOSIS — F322 Major depressive disorder, single episode, severe without psychotic features: Secondary | ICD-10-CM

## 2021-01-20 ENCOUNTER — Ambulatory Visit: Payer: 59 | Admitting: Family Medicine

## 2021-02-01 ENCOUNTER — Ambulatory Visit: Payer: Self-pay | Admitting: Family Medicine

## 2021-02-09 ENCOUNTER — Ambulatory Visit: Payer: Self-pay | Admitting: Family Medicine

## 2021-02-14 ENCOUNTER — Encounter: Payer: Self-pay | Admitting: Family Medicine

## 2021-02-14 DIAGNOSIS — F322 Major depressive disorder, single episode, severe without psychotic features: Secondary | ICD-10-CM

## 2021-02-16 MED ORDER — CITALOPRAM HYDROBROMIDE 20 MG PO TABS: 20.0000 mg | ORAL_TABLET | Freq: Every day | ORAL | 0 refills | Status: DC

## 2021-02-24 ENCOUNTER — Ambulatory Visit: Payer: Self-pay | Admitting: Family Medicine

## 2021-02-26 ENCOUNTER — Ambulatory Visit: Payer: Self-pay | Admitting: Family Medicine

## 2021-02-26 ENCOUNTER — Other Ambulatory Visit: Payer: Self-pay

## 2021-02-26 ENCOUNTER — Encounter: Payer: Self-pay | Admitting: Family Medicine

## 2021-02-26 DIAGNOSIS — F322 Major depressive disorder, single episode, severe without psychotic features: Secondary | ICD-10-CM

## 2021-02-26 MED ORDER — CITALOPRAM HYDROBROMIDE 20 MG PO TABS: 20.0000 mg | ORAL_TABLET | Freq: Every day | ORAL | 3 refills | Status: DC

## 2021-02-26 NOTE — Progress Notes (Signed)
Subjective:    Patient ID: Thomas Burke, male    DOB: [birth year], 35 y.o.   MRN: [id number]  Thomas Burke is a 35 y.o. male presenting on 02/26/2021 for Depression   HPI  Major Depression, moderate, recurrent He has chronic history of depression and mood swings worse off medication Currently doing better, still has depression but he is able to manage, less symptoms of mood overall. He says a lot of triggers associated with relationship problem. He lives in [place] now and has traveled here for doctors visit On Citalopram 20mg  daily   Depression screen [facility] 2/9 02/26/2021 04/21/2020 01/17/2020  Decreased Interest 3 3 0  Down, Depressed, Hopeless 3 3 0  PHQ - 2 Score 6 6 0  Altered sleeping 3 3 -  Tired, decreased energy 1 1 -  Change in appetite 1 3 -  Feeling bad or failure about yourself  3 3 -  Trouble concentrating 1 3 -  Moving slowly or fidgety/restless 1 3 -  Suicidal thoughts 1 3 -  PHQ-9 Score 17 25 -  Difficult doing work/chores Somewhat difficult Extremely dIfficult -   GAD 7 : Generalized Anxiety Score 02/26/2021  Nervous, Anxious, on Edge 3  Control/stop worrying 3  Worry too much - different things 3  Trouble relaxing 3  Restless 3  Easily annoyed or irritable 3  Afraid - awful might happen 3  Total GAD 7 Score 21  Anxiety Difficulty Somewhat difficult      Social History   Tobacco Use   Smoking status: Every Day    Packs/day: 1.50    Years: 12.00    Pack years: 18.00    Types: Cigarettes   Smokeless tobacco: Never  Vaping Use   Vaping Use: Never used  Substance Use Topics   Alcohol use: Yes    Alcohol/week: 2.0 standard drinks    Types: 2 Cans of beer per week   Drug use: No    Review of Systems Per HPI unless specifically indicated above     Objective:    BP 136/70 (BP Location: Left Arm, Patient Position: Sitting, Cuff Size: Normal)    Pulse 77    Ht 5\' 8"  (1.727 m)    Wt 133 lb (60.3 kg)    SpO2 98%    BMI 20.22  kg/m   Wt Readings from Last 3 Encounters:  02/26/21 133 lb (60.3 kg)  04/22/20 125 lb (56.7 kg)  01/17/20 124 lb (56.2 kg)    Physical Exam Vitals and nursing note reviewed.  Constitutional:      General: He is not in acute distress.    Appearance: Normal appearance. He is well-developed. He is not diaphoretic.     Comments: Well-appearing, comfortable, cooperative  HENT:     Head: Normocephalic and atraumatic.  Eyes:     General:        Right eye: No discharge.        Left eye: No discharge.     Conjunctiva/sclera: Conjunctivae normal.  Cardiovascular:     Rate and Rhythm: Normal rate.  Pulmonary:     Effort: Pulmonary effort is normal.  Skin:    General: Skin is warm and dry.     Findings: No erythema or rash.  Neurological:     Mental Status: He is alert and oriented to person, place, and time.  Psychiatric:        Mood and Affect: Mood normal.  Behavior: Behavior normal.        Thought Content: Thought content normal.     Comments: Well groomed, good eye contact, normal speech and thoughts   Results for orders placed or performed during the hospital encounter of 04/22/20  Resp Panel by RT-PCR (Flu A&B, Covid) Nasopharyngeal Swab   Specimen: Nasopharyngeal Swab; Nasopharyngeal(NP) swabs in vial transport medium  Result Value Ref Range   SARS Coronavirus 2 by RT PCR NEGATIVE NEGATIVE   Influenza A by PCR NEGATIVE NEGATIVE   Influenza B by PCR NEGATIVE NEGATIVE  Comprehensive metabolic panel  Result Value Ref Range   Sodium 136 135 - 145 mmol/L   Potassium 3.3 (L) 3.5 - 5.1 mmol/L   Chloride 99 98 - 111 mmol/L   CO2 23 22 - 32 mmol/L   Glucose, Bld 94 70 - 99 mg/dL   BUN <5 (L) 6 - 20 mg/dL   Creatinine, Ser 0.90 0.61 - 1.24 mg/dL   Calcium 9.2 8.9 - 10.3 mg/dL   Total Protein 7.4 6.5 - 8.1 g/dL   Albumin 4.5 3.5 - 5.0 g/dL   AST 16 15 - 41 U/L   ALT 9 0 - 44 U/L   Alkaline Phosphatase 52 38 - 126 U/L   Total Bilirubin 0.6 0.3 - 1.2 mg/dL   GFR,  Estimated >60 >60 mL/min   Anion gap 14 5 - 15  Ethanol  Result Value Ref Range   Alcohol, Ethyl (B) Q000111Q Q000111Q mg/dL  Salicylate level  Result Value Ref Range   Salicylate Lvl 0000000 7.0 - 30.0 mg/dL  Acetaminophen level  Result Value Ref Range   Acetaminophen (Tylenol), Serum <10 (L) 10 - 30 ug/mL  cbc  Result Value Ref Range   WBC 22.9 (H) 4.0 - 10.5 K/uL   RBC 4.72 4.22 - 5.81 MIL/uL   Hemoglobin 15.3 13.0 - 17.0 g/dL   HCT 41.2 39.0 - 52.0 %   MCV 87.3 80.0 - 100.0 fL   MCH 32.4 26.0 - 34.0 pg   MCHC 37.1 (H) 30.0 - 36.0 g/dL   RDW 12.1 11.5 - 15.5 %   Platelets 338 150 - 400 K/uL   nRBC 0.0 0.0 - 0.2 %  Urine Drug Screen, Qualitative  Result Value Ref Range   Tricyclic, Ur Screen NONE DETECTED NONE DETECTED   Amphetamines, Ur Screen NONE DETECTED NONE DETECTED   MDMA (Ecstasy)Ur Screen NONE DETECTED NONE DETECTED   Cocaine Metabolite,Ur [place] NONE DETECTED NONE DETECTED   Opiate, Ur Screen NONE DETECTED NONE DETECTED   Phencyclidine (PCP) Ur S NONE DETECTED NONE DETECTED   Cannabinoid 50 Ng, Ur [place] NONE DETECTED NONE DETECTED   Barbiturates, Ur Screen NONE DETECTED NONE DETECTED   Benzodiazepine, Ur Scrn NONE DETECTED NONE DETECTED   Methadone Scn, Ur NONE DETECTED NONE DETECTED  CBC with Differential/Platelet  Result Value Ref Range   WBC 23.0 (H) 4.0 - 10.5 K/uL   RBC 4.67 4.22 - 5.81 MIL/uL   Hemoglobin 15.0 13.0 - 17.0 g/dL   HCT 42.3 39.0 - 52.0 %   MCV 90.6 80.0 - 100.0 fL   MCH 32.1 26.0 - 34.0 pg   MCHC 35.5 30.0 - 36.0 g/dL   RDW 12.5 11.5 - 15.5 %   Platelets 355 150 - 400 K/uL   nRBC 0.0 0.0 - 0.2 %   Neutrophils Relative % 77 %   Neutro Abs 17.9 (H) 1.7 - 7.7 K/uL   Lymphocytes Relative 15 %   Lymphs Abs 3.4  0.7 - 4.0 K/uL   Monocytes Relative 6 %   Monocytes Absolute 1.5 (H) 0.1 - 1.0 K/uL   Eosinophils Relative 0 %   Eosinophils Absolute 0.0 0.0 - 0.5 K/uL   Basophils Relative 1 %   Basophils Absolute 0.1 0.0 - 0.1 K/uL   Immature Granulocytes 1 %    Abs Immature Granulocytes 0.11 (H) 0.00 - 0.07 K/uL  Lipid panel  Result Value Ref Range   Cholesterol 135 0 - 200 mg/dL   Triglycerides 44 <150 mg/dL   HDL 45 >40 mg/dL   Total CHOL/HDL Ratio 3.0 RATIO   VLDL 9 0 - 40 mg/dL   LDL Cholesterol 81 0 - 99 mg/dL  Hemoglobin A1c  Result Value Ref Range   Hgb A1c MFr Bld 5.4 4.8 - 5.6 %   Mean Plasma Glucose 108.28 mg/dL  RPR  Result Value Ref Range   RPR Ser Ql NON REACTIVE NON REACTIVE  Urinalysis, Complete w Microscopic  Result Value Ref Range   Color, Urine STRAW (A) YELLOW   APPearance CLEAR (A) CLEAR   Specific Gravity, Urine 1.003 (L) 1.005 - 1.030   pH 6.0 5.0 - 8.0   Glucose, UA NEGATIVE NEGATIVE mg/dL   Hgb urine dipstick NEGATIVE NEGATIVE   Bilirubin Urine NEGATIVE NEGATIVE   Ketones, ur NEGATIVE NEGATIVE mg/dL   Protein, ur NEGATIVE NEGATIVE mg/dL   Nitrite NEGATIVE NEGATIVE   Leukocytes,Ua NEGATIVE NEGATIVE   RBC / HPF 0-5 0 - 5 RBC/hpf   WBC, UA 0-5 0 - 5 WBC/hpf   Bacteria, UA NONE SEEN NONE SEEN   Squamous Epithelial / LPF NONE SEEN 0 - 5  Procalcitonin - Baseline  Result Value Ref Range   Procalcitonin <0.10 ng/mL      Assessment & Plan:   Problem List Items Addressed This Visit     MDD (major depressive disorder), single episode, severe , no psychosis (HCC)   Relevant Medications   citalopram (CELEXA) 20 MG tablet    Improved depression Continue SSRI Refill Citalopram 20mg  daily up to 1 year Offer and consider adjunct therapy as well Handout AVS with info on therapy can do remote virtual options.  Meds ordered this encounter  Medications   citalopram (CELEXA) 20 MG tablet    Sig: Take 1 tablet (20 mg total) by mouth daily.    Dispense:  90 tablet    Refill:  3    Update rx with refills, changed to 90 day      Follow up plan: Return in about 1 year (around 02/26/2022) for 1 year Annual Physical fasting lab AFTER in [place], DO [facility] 02/26/2021, 12:17 PM

## 2021-02-26 NOTE — Patient Instructions (Addendum)
Thank you for coming to the office today.  These offices have both PSYCHIATRY doctors and THERAPISTS  [facility] (Virtual Available) [place] [address] [place], [place] [address] Phone: [phone]  [facility] (All ages) [address] [place], [id number] Phone: [phone] (Option 1) [email or web address]  ----------------------------------------------------------------- THERAPIST ONLY  (No Psychiatry)  [facility] [address] [place], [place] [address] [place] P: [phone]  Delight Ovens, LCSW [address] [place], [place] [address] Main Line: [phone]  DUE for FASTING BLOOD WORK (no food or drink after midnight before the lab appointment, only water or coffee without cream/sugar on the morning of)  In 1 year.  Please schedule a Follow-up Appointment to: Return in about 1 year (around 02/26/2022) for 1 year Annual Physical fasting lab AFTER in AM.  If you have any other questions or concerns, please feel free to call the office or send a message through MyChart. You may also schedule an earlier appointment if necessary.  Additionally, you may be receiving a survey about your experience at our office within a few days to 1 week by e-mail or mail. We value your feedback.  Saralyn Pilar, DO [facility], [place]

## 2021-09-24 ENCOUNTER — Ambulatory Visit: Payer: Self-pay | Admitting: Family Medicine

## 2021-09-24 ENCOUNTER — Encounter: Payer: Self-pay | Admitting: Family Medicine

## 2021-09-24 VITALS — BP 118/82 | HR 95 | Ht 68.0 in | Wt 153.8 lb

## 2021-09-24 DIAGNOSIS — K219 Gastro-esophageal reflux disease without esophagitis: Secondary | ICD-10-CM

## 2021-09-24 DIAGNOSIS — R55 Syncope and collapse: Secondary | ICD-10-CM

## 2021-09-24 DIAGNOSIS — F322 Major depressive disorder, single episode, severe without psychotic features: Secondary | ICD-10-CM

## 2021-09-24 DIAGNOSIS — K625 Hemorrhage of anus and rectum: Secondary | ICD-10-CM

## 2021-09-24 DIAGNOSIS — R109 Unspecified abdominal pain: Secondary | ICD-10-CM

## 2021-09-24 DIAGNOSIS — R1030 Lower abdominal pain, unspecified: Secondary | ICD-10-CM

## 2021-09-24 MED ORDER — CITALOPRAM HYDROBROMIDE 20 MG PO TABS: 20.0000 mg | ORAL_TABLET | Freq: Every day | ORAL | 1 refills | Status: DC

## 2021-09-24 MED ORDER — DICYCLOMINE HCL 10 MG PO CAPS: 10.0000 mg | ORAL_CAPSULE | Freq: Three times a day (TID) | ORAL | 2 refills | Status: DC

## 2021-09-24 NOTE — Progress Notes (Unsigned)
Subjective:    Patient ID: Thomas Burke, male    DOB: Jul 23, 1985, 36 y.o.   MRN: [id number]  Thomas Burke is a 36 y.o. male presenting on 09/24/2021 for Seizures, Rectal Bleeding, and Abdominal Pain   HPI  New acute problems  Rectal Bleeding, painless Reports 5 months of painless rectal bleeding bright red blood, started mild then gradually worsening. Admits frequent lower abdominal cramping aching. - Has tried anti diarrheal, pepto, gas X - Not on NSAID - Admits occasional GERD heartburn. Takes Mylanta occasional Denies fatigue  Syncope Possible Seizures  Late 2021/06/12, he was at home and in kitchen, his girlfriend heard something fall and found him. He felt a little tired and weird. He went to sit down then   2 weeks ago he was driving home from work. Unwitnessed episode of passing out.  [facility], checked all blood said all normal and Head CT.  Anxiety / Stress Father died in Jun 13, 2022 from Jul 13, 2021 Cousin died last year He is off medication would like to restart SSRI      09/24/2021    9:35 AM 02/26/2021   10:47 AM 04/21/2020    3:49 PM  Depression screen PHQ 2/9  Decreased Interest 3 3 3   Down, Depressed, Hopeless 3 3 3   PHQ - 2 Score 6 6 6   Altered sleeping 3 3 3   Tired, decreased energy 1 1 1   Change in appetite 1 1 3   Feeling bad or failure about yourself  2 3 3   Trouble concentrating 1 1 3   Moving slowly or fidgety/restless 0 1 3  Suicidal thoughts 1 1 3   PHQ-9 Score 15 17 25   Difficult doing work/chores  Somewhat difficult Extremely dIfficult    Social History   Tobacco Use   Smoking status: Every Day    Packs/day: 1.50    Years: 12.00    Total pack years: 18.00    Types: Cigarettes   Smokeless tobacco: Never  Vaping Use   Vaping Use: Never used  Substance Use Topics   Alcohol use: Yes    Alcohol/week: 2.0 standard drinks of alcohol    Types: 2 Cans of beer per week   Drug use: No    Review of  Systems Per HPI unless specifically indicated above     Objective:    BP 118/82   Pulse 95   Ht 5\' 8"  (1.727 m)   Wt 153 lb 12.8 oz (69.8 kg)   SpO2 100%   BMI 23.39 kg/m   Wt Readings from Last 3 Encounters:  09/24/21 153 lb 12.8 oz (69.8 kg)  02/26/21 133 lb (60.3 kg)  04/22/20 125 lb (56.7 kg)    Physical Exam Vitals and nursing note reviewed.  Constitutional:      General: He is not in acute distress.    Appearance: He is well-developed. He is not diaphoretic.     Comments: Well-appearing, comfortable, cooperative  HENT:     Head: Normocephalic and atraumatic.  Eyes:     General:        Right eye: No discharge.        Left eye: No discharge.     Conjunctiva/sclera: Conjunctivae normal.  Neck:     Thyroid: No thyromegaly.  Cardiovascular:     Rate and Rhythm: Normal rate and regular rhythm.     Pulses: Normal pulses.     Heart sounds: Normal heart sounds. No murmur heard. Pulmonary:     Effort:  Pulmonary effort is normal. No respiratory distress.     Breath sounds: Normal breath sounds. No wheezing or rales.  Musculoskeletal:        General: Normal range of motion.     Cervical back: Normal range of motion and neck supple.  Lymphadenopathy:     Cervical: No cervical adenopathy.  Skin:    General: Skin is warm and dry.     Findings: No erythema or rash.  Neurological:     General: No focal deficit present.     Mental Status: He is alert and oriented to person, place, and time. Mental status is at baseline.     Motor: No weakness.  Psychiatric:        Behavior: Behavior normal.     Comments: Well groomed, good eye contact, normal speech and thoughts    Results for orders placed or performed during the hospital encounter of 04/22/20  Resp Panel by RT-PCR (Flu A&B, Covid) Nasopharyngeal Swab   Specimen: Nasopharyngeal Swab; Nasopharyngeal(NP) swabs in vial transport medium  Result Value Ref Range   SARS Coronavirus 2 by RT PCR NEGATIVE NEGATIVE   Influenza  A by PCR NEGATIVE NEGATIVE   Influenza B by PCR NEGATIVE NEGATIVE  Comprehensive metabolic panel  Result Value Ref Range   Sodium 136 135 - 145 mmol/L   Potassium 3.3 (L) 3.5 - 5.1 mmol/L   Chloride 99 98 - 111 mmol/L   CO2 23 22 - 32 mmol/L   Glucose, Bld 94 70 - 99 mg/dL   BUN <5 (L) 6 - 20 mg/dL   Creatinine, Ser 0.90 0.61 - 1.24 mg/dL   Calcium 9.2 8.9 - 10.3 mg/dL   Total Protein 7.4 6.5 - 8.1 g/dL   Albumin 4.5 3.5 - 5.0 g/dL   AST 16 15 - 41 U/L   ALT 9 0 - 44 U/L   Alkaline Phosphatase 52 38 - 126 U/L   Total Bilirubin 0.6 0.3 - 1.2 mg/dL   GFR, Estimated >60 >60 mL/min   Anion gap 14 5 - 15  Ethanol  Result Value Ref Range   Alcohol, Ethyl (B) Q000111Q Q000111Q mg/dL  Salicylate level  Result Value Ref Range   Salicylate Lvl 0000000 7.0 - 30.0 mg/dL  Acetaminophen level  Result Value Ref Range   Acetaminophen (Tylenol), Serum <10 (L) 10 - 30 ug/mL  cbc  Result Value Ref Range   WBC 22.9 (H) 4.0 - 10.5 K/uL   RBC 4.72 4.22 - 5.81 MIL/uL   Hemoglobin 15.3 13.0 - 17.0 g/dL   HCT 41.2 39.0 - 52.0 %   MCV 87.3 80.0 - 100.0 fL   MCH 32.4 26.0 - 34.0 pg   MCHC 37.1 (H) 30.0 - 36.0 g/dL   RDW 12.1 11.5 - 15.5 %   Platelets 338 150 - 400 K/uL   nRBC 0.0 0.0 - 0.2 %  Urine Drug Screen, Qualitative  Result Value Ref Range   Tricyclic, Ur Screen NONE DETECTED NONE DETECTED   Amphetamines, Ur Screen NONE DETECTED NONE DETECTED   MDMA (Ecstasy)Ur Screen NONE DETECTED NONE DETECTED   Cocaine Metabolite,Ur [place] NONE DETECTED NONE DETECTED   Opiate, Ur Screen NONE DETECTED NONE DETECTED   Phencyclidine (PCP) Ur S NONE DETECTED NONE DETECTED   Cannabinoid 50 Ng, Ur [place] NONE DETECTED NONE DETECTED   Barbiturates, Ur Screen NONE DETECTED NONE DETECTED   Benzodiazepine, Ur Scrn NONE DETECTED NONE DETECTED   Methadone Scn, Ur NONE DETECTED NONE DETECTED  CBC with Differential/Platelet  Result Value Ref Range   WBC 23.0 (H) 4.0 - 10.5 K/uL   RBC 4.67 4.22 - 5.81 MIL/uL   Hemoglobin 15.0  13.0 - 17.0 g/dL   HCT 42.6 83.4 - 19.6 %   MCV 90.6 80.0 - 100.0 fL   MCH 32.1 26.0 - 34.0 pg   MCHC 35.5 30.0 - 36.0 g/dL   RDW 22.2 97.9 - 89.2 %   Platelets 355 150 - 400 K/uL   nRBC 0.0 0.0 - 0.2 %   Neutrophils Relative % 77 %   Neutro Abs 17.9 (H) 1.7 - 7.7 K/uL   Lymphocytes Relative 15 %   Lymphs Abs 3.4 0.7 - 4.0 K/uL   Monocytes Relative 6 %   Monocytes Absolute 1.5 (H) 0.1 - 1.0 K/uL   Eosinophils Relative 0 %   Eosinophils Absolute 0.0 0.0 - 0.5 K/uL   Basophils Relative 1 %   Basophils Absolute 0.1 0.0 - 0.1 K/uL   Immature Granulocytes 1 %   Abs Immature Granulocytes 0.11 (H) 0.00 - 0.07 K/uL  Lipid panel  Result Value Ref Range   Cholesterol 135 0 - 200 mg/dL   Triglycerides 44 <119 mg/dL   HDL 45 >41 mg/dL   Total CHOL/HDL Ratio 3.0 RATIO   VLDL 9 0 - 40 mg/dL   LDL Cholesterol 81 0 - 99 mg/dL  Hemoglobin D4Y  Result Value Ref Range   Hgb A1c MFr Bld 5.4 4.8 - 5.6 %   Mean Plasma Glucose 108.28 mg/dL  RPR  Result Value Ref Range   RPR Ser Ql NON REACTIVE NON REACTIVE  Urinalysis, Complete w Microscopic  Result Value Ref Range   Color, Urine STRAW (A) YELLOW   APPearance CLEAR (A) CLEAR   Specific Gravity, Urine 1.003 (L) 1.005 - 1.030   pH 6.0 5.0 - 8.0   Glucose, UA NEGATIVE NEGATIVE mg/dL   Hgb urine dipstick NEGATIVE NEGATIVE   Bilirubin Urine NEGATIVE NEGATIVE   Ketones, ur NEGATIVE NEGATIVE mg/dL   Protein, ur NEGATIVE NEGATIVE mg/dL   Nitrite NEGATIVE NEGATIVE   Leukocytes,Ua NEGATIVE NEGATIVE   RBC / HPF 0-5 0 - 5 RBC/hpf   WBC, UA 0-5 0 - 5 WBC/hpf   Bacteria, UA NONE SEEN NONE SEEN   Squamous Epithelial / LPF NONE SEEN 0 - 5  Procalcitonin - Baseline  Result Value Ref Range   Procalcitonin <0.10 ng/mL      Assessment & Plan:   Problem List Items Addressed This Visit     MDD (major depressive disorder), single episode, severe , no psychosis (HCC)   Relevant Medications   citalopram (CELEXA) 20 MG tablet   Other Visit Diagnoses      Lower abdominal pain    -  Primary   Relevant Medications   dicyclomine (BENTYL) 10 MG capsule   Painless rectal bleeding       Gastroesophageal reflux disease without esophagitis       Relevant Medications   dicyclomine (BENTYL) 10 MG capsule   Abdominal cramping       Relevant Medications   dicyclomine (BENTYL) 10 MG capsule   Syncope, unspecified syncope type           Limitation today patient is without insurance coverage, he is self pay and has financial limitation He lives in [place] and traveled here  Suspect Internal Hemorrhoids causing the painless rectal bleeding. Duration >5 months approx Goal to limit provoking factors Discussed that ultimately if hemorrhoidal bleeding, would benefit from consult  to GI for diagnostic further eval of source of bleeding w/ colonoscopy and may pursue int hemorrhoid banding if severe bleeding still. Order Dicyclomine for functional bowel problems w/ cramping  Unspecified Syncopal Episodes Cannot confirm seizure activity as unwitnessed Unable to review record from local ED in [place], however stated that labs and CT imaging were done unremarkable.  If persistent concern or symptoms, he should present to ED again for repeat flares. I am worried about the possible seizures or passing out episodes Discussed driving precautions and restriction that is recommended if this is seizure activity or unknown etiology  Recommend Neurology consult, gave info on local neuro near to him. He can contact for self referral info  Depression recurrent, anxiety Will restart Citalopram 20mg  daily, he has self discontinued previously.  Meds ordered this encounter  Medications   dicyclomine (BENTYL) 10 MG capsule    Sig: Take 1 capsule (10 mg total) by mouth 4 (four) times daily -  before meals and at bedtime. As needed    Dispense:  30 capsule    Refill:  2   citalopram (CELEXA) 20 MG tablet    Sig: Take 1 tablet (20 mg total) by mouth daily.     Dispense:  90 tablet    Refill:  1      Follow up plan: Return if symptoms worsen or fail to improve.   , DO [facility] 09/24/2021, 9:21 AM

## 2021-09-24 NOTE — Patient Instructions (Addendum)
Thank you for coming to the office today.  Suspect Internal Hemorrhoids causing the bleeding.  I am worried about the possible seizures or passing out episodes  I would recommend that you contact a Neurologist to do further evaluation  Only one I can find in [place]  Dr Charna Busman MD  Our Primary location :[address], [place], [place]  [address] We are in the 2 story brick building behind [facility]  ** Phone [phone] * Fax [phone] **   ---------------------------------------------------------------- For genital irritation of skin.  Try Vaseline or other ointment to help the area heal.   As discussed, I do not know the exact cause of your syncopal episodes (passing out), usually we divide this into either concerning or less concerning syncopal episodes. The most common type is Vasovagal Syncope, often described as you did with feeling flushed or sweating, lightheaded or dizzy, usually these are random episodes, triggered by a variety of causes (can be stress, emotional, physical, straining with exercise or bowel movement even, dehydration poor intake). Still a medical concern, but usually more of a benign problem, there is limited treatment or testing to be done for this type of syncope.  The concerning syncope is either caused by Cardiac (Heart) or Neurogenic (Brain), usually provoked by exertional activity, associated with high risk symptoms chest pain, tightness or pressure, shortness of breath, headache, or stroke like symptoms significant facial or arm/leg weakness, numbness, or related to seizure activity - if you develop any of these symptoms seek help immediately at hospital ED.    From now on, be mindful of possible syncopal episodes, I would encourage increase water hydration, try using a bottle or way to measure water intake, goal for at least 12-16 oz container about 2-3 times a day, can reduce tea intake, as this tends to  cause you to be more dehydrated.   Try to limit the alcohol and improve hydration.  If syncopal episode occurs again without any of the above significant red flag symptoms, and it resolves on it's own and you don't feel persistently sick.   Please schedule a Follow-up Appointment to: Return if symptoms worsen or fail to improve.  If you have any other questions or concerns, please feel free to call the office or send a message through MyChart. You may also schedule an earlier appointment if necessary.  Additionally, you may be receiving a survey about your experience at our office within a few days to 1 week by e-mail or mail. We value your feedback.  Saralyn Pilar, DO [facility], [place]

## 2022-06-03 ENCOUNTER — Telehealth: Payer: Self-pay | Admitting: Family Medicine

## 2022-06-03 DIAGNOSIS — R109 Unspecified abdominal pain: Secondary | ICD-10-CM

## 2022-06-03 DIAGNOSIS — R1030 Lower abdominal pain, unspecified: Secondary | ICD-10-CM

## 2022-06-03 DIAGNOSIS — R112 Nausea with vomiting, unspecified: Secondary | ICD-10-CM

## 2022-06-04 MED ORDER — ONDANSETRON HCL 4 MG PO TABS: 4.0000 mg | ORAL_TABLET | Freq: Three times a day (TID) | ORAL | 0 refills | Status: DC | PRN

## 2022-06-04 MED ORDER — DICYCLOMINE HCL 10 MG PO CAPS: 10.0000 mg | ORAL_CAPSULE | Freq: Three times a day (TID) | ORAL | 0 refills | Status: DC

## 2022-06-04 NOTE — Addendum Note (Signed)
Addended by: Dellia Nims on: 06/04/2022 09:47 AM   Modules accepted: Orders

## 2022-06-04 NOTE — Progress Notes (Signed)
E-Visit for Nausea and Vomiting   We are sorry that you are not feeling well. Here is how we plan to help!  Based on what you have shared with me it looks like you have a Virus that is irritating your GI tract.  Vomiting is the forceful emptying of a portion of the stomach's content through the mouth.  Although nausea and vomiting can make you feel miserable, it's important to remember that these are not diseases, but rather symptoms of an underlying illness.  When we treat short term symptoms, we always caution that any symptoms that persist should be fully evaluated in a medical office.  I have prescribed a medication that will help alleviate your symptoms and allow you to stay hydrated:  Zofran 4 mg 1 tablet every 8 hours as needed for nausea and vomiting  HOME CARE: Drink clear liquids.  This is very important! Dehydration (the lack of fluid) can lead to a serious complication.  Start off with 1 tablespoon every 5 minutes for 8 hours. You may begin eating bland foods after 8 hours without vomiting.  Start with saltine crackers, white bread, rice, mashed potatoes, applesauce. After 48 hours on a bland diet, you may resume a normal diet. Try to go to sleep.  Sleep often empties the stomach and relieves the need to vomit.  GET HELP RIGHT AWAY IF:  Your symptoms do not improve or worsen within 2 days after treatment. You have a fever for over 3 days. You cannot keep down fluids after trying the medication.  MAKE SURE YOU:  Understand these instructions. Will watch your condition. Will get help right away if you are not doing well or get worse.    Thank you for choosing an e-visit.  Your e-visit answers were reviewed by a board certified advanced clinical practitioner to complete your personal care plan. Depending upon the condition, your plan could have included both over the counter or prescription medications.  Please review your pharmacy choice. Make sure the pharmacy is open so  you can pick up prescription now. If there is a problem, you may contact your provider through MyChart messaging and have the prescription routed to another pharmacy.  Your safety is important to us. If you have drug allergies check your prescription carefully.   For the next 24 hours you can use MyChart to ask questions about today's visit, request a non-urgent call back, or ask for a work or school excuse. You will get an email in the next two days asking about your experience. I hope that your e-visit has been valuable and will speed your recovery.    have provided 5 minutes of non face to face time during this encounter for chart review and documentation.   

## 2022-09-28 ENCOUNTER — Other Ambulatory Visit: Payer: Self-pay | Admitting: Family Medicine

## 2022-09-28 ENCOUNTER — Ambulatory Visit: Payer: Self-pay | Admitting: *Deleted

## 2022-09-28 DIAGNOSIS — F419 Anxiety disorder, unspecified: Secondary | ICD-10-CM

## 2022-09-28 MED ORDER — DIAZEPAM 5 MG PO TABS: ORAL_TABLET | ORAL | 0 refills | Status: DC

## 2022-09-28 NOTE — Telephone Encounter (Signed)
Message from Dorchester T sent at 09/28/2022  2:42 PM EDT  Summary: valium needed for blood test   Patient called stated he needs a valium to have a blood test on 7/26 at 9am. Please f/u with patient          Call History  Contact Date/Time Type Contact Phone/Fax User  09/28/2022 02:40 PM EDT Phone (Incoming) Thomas Burke, Thomas Burke (Self) [phone] Judie Petit) [name]   Reason for Disposition  Prescription request for new medicine (not a refill)    Pt wanting to know if Valium can be called in for him for a blood draw.  Answer Assessment - Initial Assessment Questions 1. NAME of MEDICINE: "What medicine(s) are you calling about?"     I ned some Valium for a blood draw that will be done on 09/30/2022 at 9:00 AM.    I have trouble with needles.   Dr. Althea Charon has called this in for me before but it's been a long time.    I have not needed to have blood work done in a long time.  I've been having seizures so they want to do blood work and an EKG.  2. QUESTION: "What is your question?" (e.g., double dose of medicine, side effect)     Would Dr. Althea Charon call in some Valium for me for a blood draw?   I don't do well with needles. 3. PRESCRIBER: "Who prescribed the medicine?" Reason: if prescribed by specialist, call should be referred to that group.     Dr. Althea Charon. 4. SYMPTOMS: "Do you have any symptoms?" If Yes, ask: "What symptoms are you having?"  "How bad are the symptoms (e.g., mild, moderate, severe)     I'm having blood drawn and an EKG done because I've been having seizures. 5. PREGNANCY:  "Is there any chance that you are pregnant?" "When was your last menstrual period?"     N/A  Protocols used: Medication Question Call-A-AH

## 2022-09-28 NOTE — Telephone Encounter (Signed)
  Chief Complaint: Pt for blood draw on 09/30/2022 at 9:00 and is requesting Valium be called inton [facility] in [place], [place].   "I don't do well with needles".   "Dr. Althea Charon has done this for me before but it's been a long time ago". Symptoms: I'm having seizures so they want to draw blood and do an EKG and I don't do well with needles. Frequency: It's for 09/30/2022 at 9:00 AM.    I'm having an EKG done too. Pertinent Negatives: Patient denies N/A Disposition: [] ED /[] Urgent Care (no appt availability in office) / [] Appointment(In office/virtual)/ []  [facility]/ [] Home Care/ [] Refused Recommended Disposition /[] [facility]/ [x]  Follow-up with PCP Additional Notes: Message sent to Dr. Althea Charon with his request.    Pt. Agreeable to someone calling him back.

## 2022-09-28 NOTE — Telephone Encounter (Signed)
Please notify him  I am willing to send in Diazepam x 2 pills only for procedural anxiety with blood draw.  Rx sent to the pharmacy as requested. Instructions on bottle.  Saralyn Pilar, DO [facility] 09/28/2022, 3:29 PM

## 2022-09-28 NOTE — Addendum Note (Signed)
Addended by: Smitty Cords on: 09/28/2022 03:29 PM   Modules accepted: Orders

## 2022-10-13 ENCOUNTER — Other Ambulatory Visit: Payer: Self-pay | Admitting: Family Medicine

## 2022-10-13 DIAGNOSIS — F419 Anxiety disorder, unspecified: Secondary | ICD-10-CM

## 2022-10-14 MED ORDER — DIAZEPAM 5 MG PO TABS
ORAL_TABLET | ORAL | 0 refills | Status: DC
Start: 2022-10-14 — End: 2023-07-20

## 2023-07-20 ENCOUNTER — Ambulatory Visit (INDEPENDENT_AMBULATORY_CARE_PROVIDER_SITE_OTHER): Payer: Self-pay | Admitting: Family Medicine

## 2023-07-20 ENCOUNTER — Encounter: Payer: Self-pay | Admitting: Family Medicine

## 2023-07-20 VITALS — BP 122/84 | HR 84 | Ht 68.0 in | Wt 150.5 lb

## 2023-07-20 DIAGNOSIS — G40909 Epilepsy, unspecified, not intractable, without status epilepticus: Secondary | ICD-10-CM

## 2023-07-20 NOTE — Progress Notes (Signed)
 Subjective:    Patient ID: Thomas Burke, male    DOB: 11-21-1985, 38 y.o.   MRN: [id number]  Thomas Burke is a 38 y.o. male presenting on 07/20/2023 for Seizures   HPI  Discussed the use of AI scribe software for clinical note transcription with the patient, who gave verbal consent to proceed.  History of Present Illness   Thomas Burke is a 38 year old male who presents with recurrent seizures.  Last apt with me 09/24/21. He has moved out of town for several years and now returns to [place] region.  Here today for recent seizure activity. This is an ongoing problem.  He has been experiencing seizures since March 2023, with the first episode occurring at home after returning from dropping off his children. Initial evaluation at a hospital included blood tests, a CT scan is reported and possibly MRI were reported negative, however we do not have records from this outside hospital.  Subsequent seizures occurred a few months later while returning from work, in July 2024 after a beach trip, and most recent seizure activity occurred on Jul 12, 2023, while at work.  He experiences a sensation of 'tunnel vision' and fatigue prior to seizures, which he can sometimes alleviate by sitting down and drinking electrolytes. However, the most recent seizure occurred without warning while he was at his desk at work. During seizures, he experiences mild muscle spasms and stiffness, and his eyes remain open. Seizures last approximately three minutes.  He was previously prescribed Keppra for seizure management but discontinued it due to intolerable side effects. He is currently not on any seizure medication.  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 He works primarily on night shifts and has been avoiding operating forklifts and scissor lifts since the onset of his seizures. His  work will need documentation following the recent seizure.  He has waived his insurance benefits and does not have coverage right now.  He can do most job tasks safely that do not involve potential harm, however the biggest concern is his 45 minute commute from [place] to [place] to and from work.      07/20/2023    8:02 AM 09/24/2021    9:35 AM 02/26/2021   10:47 AM  Depression screen PHQ 2/9  Decreased Interest 1 3 3   Down, Depressed, Hopeless 0 3 3  PHQ - 2 Score 1 6 6   Altered sleeping 2 3 3   Tired, decreased energy 3 1 1   Change in appetite 1 1 1   Feeling bad or failure about yourself  0 2 3  Trouble concentrating 0 1 1  Moving slowly or fidgety/restless 3 0 1  Suicidal thoughts 0 1 1  PHQ-9 Score 10 15 17   Difficult doing work/chores Very difficult  Somewhat difficult       07/20/2023    8:03 AM 09/24/2021    9:36 AM 02/26/2021   10:47 AM  GAD 7 : Generalized Anxiety Score  Nervous, Anxious, on Edge 3 3 3   Control/stop worrying 2 3 3   Worry too much - different things 2 3 3   Trouble relaxing 1 3 3   Restless 1 3 3   Easily annoyed or irritable 1 3 3   Afraid - awful might happen 1 3 3   Total GAD 7 Score 11 21 21   Anxiety Difficulty Very difficult Somewhat difficult Somewhat difficult    Social  History   Tobacco Use   Smoking status: Every Day    Current packs/day: 1.50    Average packs/day: 1.5 packs/day for 12.0 years (18.0 ttl pk-yrs)    Types: Cigarettes   Smokeless tobacco: Never  Vaping Use   Vaping status: Never Used  Substance Use Topics   Alcohol use: Yes    Alcohol/week: 2.0 standard drinks of alcohol    Types: 2 Cans of beer per week   Drug use: No    Review of Systems Per HPI unless specifically indicated above     Objective:     BP 122/84 (BP Location: Left Arm, Patient Position: Sitting, Cuff Size: Normal)   Pulse 84   Ht 5\' 8"  (1.727 m)   Wt 150 lb 8 oz (68.3 kg)   SpO2 97%   BMI 22.88 kg/m   Wt Readings from Last 3 Encounters:   07/20/23 150 lb 8 oz (68.3 kg)  09/24/21 153 lb 12.8 oz (69.8 kg)  02/26/21 133 lb (60.3 kg)    Physical Exam Vitals and nursing note reviewed.  Constitutional:      General: He is not in acute distress.    Appearance: Normal appearance. He is well-developed. He is not diaphoretic.     Comments: Well-appearing, comfortable, cooperative  HENT:     Head: Normocephalic and atraumatic.  Eyes:     General:        Right eye: No discharge.        Left eye: No discharge.     Conjunctiva/sclera: Conjunctivae normal.  Cardiovascular:     Rate and Rhythm: Normal rate.  Pulmonary:     Effort: Pulmonary effort is normal.  Skin:    General: Skin is warm and dry.     Findings: No erythema or rash.  Neurological:     Mental Status: He is alert and oriented to person, place, and time.  Psychiatric:        Mood and Affect: Mood normal.        Behavior: Behavior normal.        Thought Content: Thought content normal.     Comments: Well groomed, good eye contact, normal speech and thoughts     Results for orders placed or performed during the hospital encounter of 04/22/20  Resp Panel by RT-PCR (Flu A&B, Covid) Nasopharyngeal Swab   Collection Time: 04/22/20  6:15 AM   Specimen: Nasopharyngeal Swab; Nasopharyngeal(NP) swabs in vial transport medium  Result Value Ref Range   SARS Coronavirus 2 by RT PCR NEGATIVE NEGATIVE   Influenza A by PCR NEGATIVE NEGATIVE   Influenza B by PCR NEGATIVE NEGATIVE  Comprehensive metabolic panel   Collection Time: 04/22/20  6:15 AM  Result Value Ref Range   Sodium 136 135 - 145 mmol/L   Potassium 3.3 (L) 3.5 - 5.1 mmol/L   Chloride 99 98 - 111 mmol/L   CO2 23 22 - 32 mmol/L   Glucose, Bld 94 70 - 99 mg/dL   BUN <5 (L) 6 - 20 mg/dL   Creatinine, Ser 2.13 0.61 - 1.24 mg/dL   Calcium 9.2 8.9 - 08.6 mg/dL   Total Protein 7.4 6.5 - 8.1 g/dL   Albumin 4.5 3.5 - 5.0 g/dL   AST 16 15 - 41 U/L   ALT 9 0 - 44 U/L   Alkaline Phosphatase 52 38 - 126 U/L    Total Bilirubin 0.6 0.3 - 1.2 mg/dL   GFR, Estimated >57 >84 mL/min   Anion gap  14 5 - 15  Ethanol   Collection Time: 04/22/20  6:15 AM  Result Value Ref Range   Alcohol, Ethyl (B) <10 <10 mg/dL  Salicylate level   Collection Time: 04/22/20  6:15 AM  Result Value Ref Range   Salicylate Lvl 20.2 7.0 - 30.0 mg/dL  Acetaminophen  level   Collection Time: 04/22/20  6:15 AM  Result Value Ref Range   Acetaminophen  (Tylenol ), Serum <10 (L) 10 - 30 ug/mL  cbc   Collection Time: 04/22/20  6:15 AM  Result Value Ref Range   WBC 22.9 (H) 4.0 - 10.5 K/uL   RBC 4.72 4.22 - 5.81 MIL/uL   Hemoglobin 15.3 13.0 - 17.0 g/dL   HCT 13.0 86.5 - 78.4 %   MCV 87.3 80.0 - 100.0 fL   MCH 32.4 26.0 - 34.0 pg   MCHC 37.1 (H) 30.0 - 36.0 g/dL   RDW 69.6 29.5 - 28.4 %   Platelets 338 150 - 400 K/uL   nRBC 0.0 0.0 - 0.2 %  Urine Drug Screen, Qualitative   Collection Time: 04/22/20  6:15 AM  Result Value Ref Range   Tricyclic, Ur Screen NONE DETECTED NONE DETECTED   Amphetamines, Ur Screen NONE DETECTED NONE DETECTED   MDMA (Ecstasy)Ur Screen NONE DETECTED NONE DETECTED   Cocaine Metabolite,Ur [place] NONE DETECTED NONE DETECTED   Opiate, Ur Screen NONE DETECTED NONE DETECTED   Phencyclidine (PCP) Ur S NONE DETECTED NONE DETECTED   Cannabinoid 50 Ng, Ur [place] NONE DETECTED NONE DETECTED   Barbiturates, Ur Screen NONE DETECTED NONE DETECTED   Benzodiazepine, Ur Scrn NONE DETECTED NONE DETECTED   Methadone Scn, Ur NONE DETECTED NONE DETECTED  CBC with Differential/Platelet   Collection Time: 04/22/20  6:15 AM  Result Value Ref Range   WBC 23.0 (H) 4.0 - 10.5 K/uL   RBC 4.67 4.22 - 5.81 MIL/uL   Hemoglobin 15.0 13.0 - 17.0 g/dL   HCT 13.2 44.0 - 10.2 %   MCV 90.6 80.0 - 100.0 fL   MCH 32.1 26.0 - 34.0 pg   MCHC 35.5 30.0 - 36.0 g/dL   RDW 72.5 36.6 - 44.0 %   Platelets 355 150 - 400 K/uL   nRBC 0.0 0.0 - 0.2 %   Neutrophils Relative % 77 %   Neutro Abs 17.9 (H) 1.7 - 7.7 K/uL   Lymphocytes Relative 15 %    Lymphs Abs 3.4 0.7 - 4.0 K/uL   Monocytes Relative 6 %   Monocytes Absolute 1.5 (H) 0.1 - 1.0 K/uL   Eosinophils Relative 0 %   Eosinophils Absolute 0.0 0.0 - 0.5 K/uL   Basophils Relative 1 %   Basophils Absolute 0.1 0.0 - 0.1 K/uL   Immature Granulocytes 1 %   Abs Immature Granulocytes 0.11 (H) 0.00 - 0.07 K/uL  Lipid panel   Collection Time: 04/22/20  6:15 AM  Result Value Ref Range   Cholesterol 135 0 - 200 mg/dL   Triglycerides 44 <347 mg/dL   HDL 45 >42 mg/dL   Total CHOL/HDL Ratio 3.0 RATIO   VLDL 9 0 - 40 mg/dL   LDL Cholesterol 81 0 - 99 mg/dL  Hemoglobin V9D   Collection Time: 04/22/20  6:15 AM  Result Value Ref Range   Hgb A1c MFr Bld 5.4 4.8 - 5.6 %   Mean Plasma Glucose 108.28 mg/dL  RPR   Collection Time: 04/22/20  6:15 AM  Result Value Ref Range   RPR Ser Ql NON REACTIVE NON REACTIVE  Urinalysis, Complete w Microscopic   Collection Time: 04/22/20  6:15 AM  Result Value Ref Range   Color, Urine STRAW (A) YELLOW   APPearance CLEAR (A) CLEAR   Specific Gravity, Urine 1.003 (L) 1.005 - 1.030   pH 6.0 5.0 - 8.0   Glucose, UA NEGATIVE NEGATIVE mg/dL   Hgb urine dipstick NEGATIVE NEGATIVE   Bilirubin Urine NEGATIVE NEGATIVE   Ketones, ur NEGATIVE NEGATIVE mg/dL   Protein, ur NEGATIVE NEGATIVE mg/dL   Nitrite NEGATIVE NEGATIVE   Leukocytes,Ua NEGATIVE NEGATIVE   RBC / HPF 0-5 0 - 5 RBC/hpf   WBC, UA 0-5 0 - 5 WBC/hpf   Bacteria, UA NONE SEEN NONE SEEN   Squamous Epithelial / HPF NONE SEEN 0 - 5  Procalcitonin - Baseline   Collection Time: 04/22/20  6:15 AM  Result Value Ref Range   Procalcitonin <0.10 ng/mL      Assessment & Plan:   Problem List Items Addressed This Visit   None Visit Diagnoses       Seizure disorder (HCC)    -  Primary        Seizure disorder Recurrent seizures since March 2023 with no clear etiology or diagnosis. Some fragmentation of care since patient moved out of town >2 hours, and no longer following up with our PCP  office and he had established with [facility] provider in that area. Also multiple hospitalization evaluations at [facility] in [place]  Prior Neurology consultation 01/24/23 Dr Garland Junk - [facility]. Past history of prior work up but they were not able to locate imaging. Uncertain if MRI result was available. Not accessible on his Epic / CareEverywhere chart.  Note Dr Garland Junk attempted further work up with EEG testing, however this was not completed due to patient's lack of insurance coverage.  He has been treated in past on Keppra anti epileptic however it was unsuccessful with side effects not able to tolerate.  Differential includes seizure disorder versus syncope or vasovagal episodes.   Discussion today that I am not capable of managing his undiagnosed seizure disorder with medications or ability to clear him at this time. Given the >2 year gap since last apt with me and incomplete Neurology work up in past the past year - this is outside of my scope of management.  He will need to establish with Neurology for seizure management, testing, medications, restrictions.  He is aware of past history of 6 month driving restrictions associated with untreated seizure activity. 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  Short-term disability may be necessary for his employer.  Now he has moved back to [place] area, he has upcoming apt with [facility] Dr Walden Guise on Monday 07/24/23  - Provide work excuse letter for full restriction from work until Jul 24, 2023. - Discuss potential for short-term disability with employer. We can assist and Neurology will be involved for short term disability to pursue work up and treatment plan. - Advise caution with driving until cleared by neurology. - Consider emergency seizure medication options with neurologist. However, discussed routine  seizure first aid with no medication required if short duration < 3-5 min seizure. Reviewed when to seek care call EMS if needed  - Monitor for seizure triggers and maintain hydration with electrolytes.        No orders of the defined types were placed in this encounter.  No orders of the defined types were placed in this encounter.   Follow up plan: Return if symptoms worsen or fail to improve.  Domingo Friend, DO [facility] 07/20/2023, 8:11 AM

## 2023-07-20 NOTE — Patient Instructions (Addendum)
 Thank you for coming to the office today.  Note written with restrictions, out of work until Monday to evaluate with Dr Evon[facility]  Seizure 911 precautions if persistent episode not responding or resolving.  I do recommend labs, imaging, EEG testing going forward  Recommend initiate Short Term Disability  Please schedule a Follow-up Appointment to: Return if symptoms worsen or fail to improve.  If you have any other questions or concerns, please feel free to call the office or send a message through MyChart. You may also schedule an earlier appointment if necessary.  Additionally, you may be receiving a survey about your experience at our office within a few days to 1 week by e-mail or mail. We value your feedback.  Domingo Friend, DO [facility], [place]

## 2023-08-10 ENCOUNTER — Encounter: Payer: Self-pay | Admitting: Family Medicine

## 2023-08-30 ENCOUNTER — Telehealth: Payer: Self-pay | Admitting: Physician Assistant

## 2023-08-30 DIAGNOSIS — L989 Disorder of the skin and subcutaneous tissue, unspecified: Secondary | ICD-10-CM

## 2023-08-30 MED ORDER — CEPHALEXIN 500 MG PO CAPS: 500.0000 mg | ORAL_CAPSULE | Freq: Four times a day (QID) | ORAL | 0 refills | Status: AC

## 2023-08-30 NOTE — Patient Instructions (Signed)
  Thomas Burke, thank you for joining Thomas GORMAN Snuffer, PA-C for today's virtual visit.  While this provider is not your primary care provider (PCP), if your PCP is located in our provider database this encounter information will be shared with them immediately following your visit.   A [facility] MyChart account gives you access to today's visit and all your visits, tests, and labs performed at [facility]  click here if you don't have a [facility] MyChart account or go to [email or web address]  Consent: (Patient) Thomas Burke provided verbal consent for this virtual visit at the beginning of the encounter.  Current Medications: No current outpatient medications on file.   Medications ordered in this encounter:  No orders of the defined types were placed in this encounter.    *If you need refills on other medications prior to your next appointment, please contact your pharmacy*  Follow-Up: Call back or seek an in-person evaluation if the symptoms worsen or if the condition fails to improve as anticipated.  [facility] [phone]  Other Instructions You were given a prescription for antibiotics. Please take the antibiotic prescription fully.   Follow up with your regular doctor in 1 week for reassessment and seek care sooner if your symptoms worsen or fail to improve.    If you have been instructed to have an in-person evaluation today at a local Urgent Care facility, please use the link below. It will take you to a list of all of our available [facility], including address, phone number and hours of operation. Please do not delay care.  [facility]  If you or a family member do not have a primary care provider, use the link below to schedule a visit and establish care. When you choose a [facility] primary care physician or advanced practice provider, you gain a long-term partner in health. Find a Primary  Care Provider  Learn more about [facility]'s in-office and virtual care options:  - Get Care Now

## 2023-08-30 NOTE — Progress Notes (Signed)
 Mr. Thomas Burke, Thomas Burke are scheduled for a virtual visit with your provider today.    Just as we do with appointments in the office, we must obtain your consent to participate.  Your consent will be active for this visit and any virtual visit you may have with one of our providers in the next 365 days.    If you have a MyChart account, I can also send a copy of this consent to you electronically.  All virtual visits are billed to your insurance company just like a traditional visit in the office.  As this is a virtual visit, video technology does not allow for your provider to perform a traditional examination.  This may limit your provider's ability to fully assess your condition.  If your provider identifies any concerns that need to be evaluated in person or the need to arrange testing such as labs, EKG, etc, we will make arrangements to do so.    Although advances in technology are sophisticated, we cannot ensure that it will always work on either your end or our end.  If the connection with a video visit is poor, we may have to switch to a telephone visit.  With either a video or telephone visit, we are not always able to ensure that we have a secure connection.   I need to obtain your verbal consent now.   Are you willing to proceed with your visit today?   WISAM SIEFRING has provided verbal consent on 08/30/2023 for a virtual visit (video or telephone).   Lynden GORMAN Snuffer, [place] 08/30/2023  4:09 PM   Date:  08/30/2023   ID:  Thomas Burke Delbra, DOB 01/01/1986, MRN [id number]  Patient Location: Home Provider Location: Home Office   Participants: Patient and Provider for Visit and Wrap up  Method of visit: Video  Location of Patient: Home Location of Provider: Home Office Consent was obtain for visit over the video. Services rendered by provider: Visit was performed via video  A video enabled telemedicine application was used and I verified that I am speaking with the correct person  using two identifiers.  PCP:  Edman Marsa PARAS, DO   Chief Complaint:  skin problem  History of Present Illness:    Thomas Burke is a 38 y.o. male with history as stated below. Presents video telehealth for an acute care visit  States that a few days ago he started having pain, swelling and redness to the left nipple. He reports the same thing is happening to the right nipple. Denies trauma or any bites to the area. Denies fevers.   Past Medical, Surgical, Social History, Allergies, and Medications have been Reviewed.  Past Medical History:  Diagnosis Date   Depression    Migraine    Persistent headaches    Seasonal allergies     No outpatient medications have been marked as taking for the 08/30/23 encounter (Video Visit) with [facility] PROVIDER.     Allergies:   Patient has no known allergies.   ROS See HPI for history of present illness.  Physical Exam Constitutional:      Appearance: Normal appearance.   Skin:    Comments: Left nipple with 1-2 cm area of erythema noted, right nipple is wnl   Neurological:     Mental Status: He is alert.                 MDM:    Tests Ordered: No orders of the defined types were placed in  this encounter.   Medication Changes: No orders of the defined types were placed in this encounter.    Disposition:  Follow up  Signed, Lynden GORMAN Snuffer, PA-C  08/30/2023 4:09 PM

## 2023-09-20 NOTE — Addendum Note (Signed)
 Addended by: VONA LYNDEN RAMAN on: 09/20/2023 04:05 PM   Modules accepted: Level of Service

## 2023-10-04 NOTE — Addendum Note (Signed)
 Addended by: VONA LYNDEN RAMAN on: 10/04/2023 04:35 PM   Modules accepted: Level of Service

## 2023-11-01 ENCOUNTER — Encounter: Payer: Self-pay | Admitting: Family Medicine

## 2024-03-01 ENCOUNTER — Telehealth: Payer: Self-pay | Admitting: Family Medicine

## 2024-03-01 ENCOUNTER — Ambulatory Visit: Payer: Self-pay | Admitting: Family Medicine

## 2024-03-01 VITALS — BP 124/80 | HR 86 | Temp 100.8°F | Ht 68.0 in | Wt 151.2 lb

## 2024-03-01 DIAGNOSIS — J101 Influenza due to other identified influenza virus with other respiratory manifestations: Secondary | ICD-10-CM

## 2024-03-01 DIAGNOSIS — R6889 Other general symptoms and signs: Secondary | ICD-10-CM

## 2024-03-01 DIAGNOSIS — M791 Myalgia, unspecified site: Secondary | ICD-10-CM | POA: Diagnosis not present

## 2024-03-01 LAB — POCT INFLUENZA A/B
Influenza A, POC: NEGATIVE
Influenza B, POC: POSITIVE — AB

## 2024-03-01 MED ORDER — BENZONATATE 100 MG PO CAPS: 100.0000 mg | ORAL_CAPSULE | Freq: Three times a day (TID) | ORAL | 0 refills | Status: DC | PRN

## 2024-03-01 MED ORDER — OSELTAMIVIR PHOSPHATE 75 MG PO CAPS: 75.0000 mg | ORAL_CAPSULE | Freq: Two times a day (BID) | ORAL | 0 refills | Status: AC

## 2024-03-01 NOTE — Patient Instructions (Addendum)
 Thank you for coming to the office today.  You tested positive for Influenza B today (rapid flu swab test in office)  - Start Flu medicine today - Based on insurance / cost coverage - either Tamiflu  (anti-flu medicine) take one capsule 75mg  twice a day for 5 days  - For symptom control (these are optional OTC medicines)      - Take Ibuprofen / Advil 400-600mg  every 6-8 hours as needed for fever / muscle aches, and may also take Tylenol  500-1000mg  per dose every 6-8 hours or 3 times a day, can alternate dosing     - May try OTC Mucinex up to 7-10 days then stop  If prescribed for you      - Start Tessalon  perls one every 8 hours or 3 times a day as needed for cough  - Wash hands and cover cough very well to avoid spread of infection - Improve hydration with plenty of clear fluids    If significant worsening with poor fluid intake, worsening fever, difficulty breathing due to coughing, worsening body aches, weakness, or other more concerning symptoms difficulty breathing you can seek treatment at Emergency Department. Also if improved flu symptoms and then worsening days to week later with concerns for bronchitis, productive cough fever chills again we may need to check for possible pneumonia that can occur after the flu      Please schedule a Follow-up Appointment to: Return if symptoms worsen or fail to improve.  If you have any other questions or concerns, please feel free to call the office or send a message through MyChart. You may also schedule an earlier appointment if necessary.  Additionally, you may be receiving a survey about your experience at our office within a few days to 1 week by e-mail or mail. We value your feedback.  Marsa Officer, DO [facility], [place]

## 2024-03-01 NOTE — Progress Notes (Signed)
 E visit for Flu like symptoms   We are sorry that you are not feeling well.  Here is how we plan to help! Based on what you have shared with me it looks like you may have flu-like symptoms that should be watched but do not seem to indicate anti-viral treatment.  Influenza or the flu is  an infection caused by a respiratory virus. The flu virus is highly contagious and persons who did not receive their yearly flu vaccination may catch the flu from close contact.  We have anti-viral medications to treat the viruses that cause this infection. They are not a cure and only shorten the course of the infection. These prescriptions are most effective when they are given within the first 2 days of flu symptoms. Antiviral medications are indicated if you have a high risk of complications from the flu. You should  also consider an antiviral medication if you are in close contact with someone who is at risk. These medications can help patients avoid complications from the flu but have side effects that you should know.   Possible side effects from Tamiflu  or oseltamivir  include nausea, vomiting, diarrhea, dizziness, headaches, eye redness, sleep problems or other respiratory symptoms. You should not take Tamiflu  if you have an allergy to oseltamivir  or any to the ingredients in Tamiflu .  For nasal congestion, you may use an oral decongestant such as Mucinex D or if you have glaucoma or high blood pressure use plain Mucinex.  Saline nasal spray or nasal drops can help and can safely be used as often as needed for congestion.  If you have a sore or scratchy throat, use a saltwater gargle-  to  teaspoon of salt dissolved in a 4-ounce to 8-ounce glass of warm water.  Gargle the solution for approximately 15-30 seconds and then spit.  It is important not to swallow the solution.  You can also use throat lozenges/cough drops and Chloraseptic spray to help with throat pain or discomfort.  Warm or cold liquids  can also be helpful in relieving throat pain.  For headache, pain or general discomfort, you can use Ibuprofen or Tylenol  as directed.   Some authorities believe that zinc sprays or the use of Echinacea may shorten the course of your symptoms.  I have prescribed the following medications to help lessen symptoms: I have prescribed Tessalon  Perles 100 mg. You may take 1-2 capsules every 8 hours as needed for cough  Also a work note was provided to [employer].  You are to isolate at home until you have been fever-free for at least 24 hours without a fever-reducing medication, and symptoms have been steadily improving for 24 hours.  If you must be around other household members who do not have symptoms, you need to make sure that both you and the family members are masking consistently with a high-quality mask.  If you note any worsening of symptoms despite treatment, please seek an in-person evaluation ASAP. If you note any significant shortness of breath or any chest pain, please seek ED evaluation. Please do not delay care!  ANYONE WHO HAS FLU SYMPTOMS SHOULD: Stay home. The flu is highly contagious and going out or to work exposes others! Be sure to drink plenty of fluids. Water is fine as well as fruit juices, sodas and electrolyte beverages. You may want to stay away from caffeine or alcohol. If you are nauseated, try taking small sips of liquids. How do you know if you are getting enough  fluid? Your urine should be a pale yellow or almost colorless. Get rest. Taking a steamy shower or using a humidifier may help nasal congestion and ease sore throat pain. Using a saline nasal spray works much the same way. Cough drops, hard candies and sore throat lozenges may ease your cough. Line up a caregiver. Have someone check on you regularly.  GET HELP RIGHT AWAY IF: You cannot keep down liquids or your medications. You become short of breath Your fell like you are going to pass out or loose  consciousness. Your symptoms persist after you have completed your treatment plan  MAKE SURE YOU  Understand these instructions. Will watch your condition. Will get help right away if you are not doing well or get worse.  Your e-visit answers were reviewed by a board certified advanced clinical practitioner to complete your personal care plan.  Depending on the condition, your plan could have included both over the counter or prescription medications.  If there is a problem please reply  once you have received a response from your provider.  Your safety is important to us .  If you have drug allergies check your prescription carefully.    You can use MyChart to ask questions about todays visit, request a non-urgent call back, or ask for a work or school excuse for 24 hours related to this e-Visit. If it has been greater than 24 hours you will need to follow up with your provider, or enter a new e-Visit to address those concerns.  You will get an e-mail in the next two days asking about your experience.  I hope that your e-visit has been valuable and will speed your recovery. Thank you for using e-visits.   I have spent 5 minutes in review of e-visit questionnaire, review and updating patient chart, medical decision making and response to patient.   Roosvelt Mater, PA-C

## 2024-03-01 NOTE — Progress Notes (Signed)
 "  Subjective:    Patient ID: Thomas Burke, male    DOB: 10/13/1985, 38 y.o.   MRN: [id number]  Thomas Burke is a 38 y.o. male presenting on 03/01/2024 for Cough (Started Monday cough,congestion,runny nose )  Patient presents for a same day appointment.   HPI  Discussed the use of AI scribe software for clinical note transcription with the patient, who gave verbal consent to proceed.  History of Present Illness   Thomas Burke is a 38 year old male who presents with flu symptoms.  Influenza-like illness - Tested positive for influenza B today - Several coworkers have similar symptoms with confirmed exposure to influenza at work - Symptoms include fever, chills, sore throat, back pain, and generalized myalgias - Cough is particularly painful, causing discomfort between the shoulders - No nausea  Respiratory symptoms - Coughing is painful and localized between the shoulders - Occasional shortness of breath or wheezing - Does not use a rescue inhaler  He did E Visit today given Tessalon  Perl Rx - Manages symptoms with cough medicine, flu medicine, Tylenol , and ibuprofen  No history of Flu vaccine this season.      07/20/2023    8:02 AM 09/24/2021    9:35 AM 02/26/2021   10:47 AM  Depression screen PHQ 2/9  Decreased Interest 1 3 3   Down, Depressed, Hopeless 0 3 3  PHQ - 2 Score 1 6 6   Altered sleeping 2 3 3   Tired, decreased energy 3 1 1   Change in appetite 1 1 1   Feeling bad or failure about yourself  0 2 3  Trouble concentrating 0 1 1  Moving slowly or fidgety/restless 3 0 1  Suicidal thoughts 0 1 1  PHQ-9 Score 10  15  17    Difficult doing work/chores Very difficult  Somewhat difficult     Data saved with a previous flowsheet row definition       07/20/2023    8:03 AM 09/24/2021    9:36 AM 02/26/2021   10:47 AM  GAD 7 : Generalized Anxiety Score  Nervous, Anxious, on Edge 3 3 3   Control/stop worrying 2 3 3   Worry too much - different  things 2 3 3   Trouble relaxing 1 3 3   Restless 1 3 3   Easily annoyed or irritable 1 3 3   Afraid - awful might happen 1 3 3   Total GAD 7 Score 11 21 21   Anxiety Difficulty Very difficult Somewhat difficult Somewhat difficult    Social History[1]  Review of Systems  Constitutional:  Positive for chills, fatigue and fever.  Respiratory:  Positive for cough.   Musculoskeletal:  Positive for arthralgias.   Per HPI unless specifically indicated above     Objective:    BP 124/80 (BP Location: Right Arm, Patient Position: Sitting, Cuff Size: Normal)   Pulse 86   Temp (!) 100.8 F (38.2 C) (Oral)   Ht 5' 8 (1.727 m)   Wt 151 lb 4 oz (68.6 kg)   SpO2 96%   BMI 23.00 kg/m   Wt Readings from Last 3 Encounters:  03/01/24 151 lb 4 oz (68.6 kg)  07/20/23 150 lb 8 oz (68.3 kg)  09/24/21 153 lb 12.8 oz (69.8 kg)    Physical Exam Vitals and nursing note reviewed.  Constitutional:      General: He is not in acute distress.    Appearance: He is well-developed. He is not diaphoretic.     Comments: Currently mild tired appearing,  cooperative  HENT:     Head: Normocephalic and atraumatic.  Eyes:     General:        Right eye: No discharge.        Left eye: No discharge.     Conjunctiva/sclera: Conjunctivae normal.  Neck:     Thyroid: No thyromegaly.  Cardiovascular:     Rate and Rhythm: Normal rate and regular rhythm.     Pulses: Normal pulses.     Heart sounds: Normal heart sounds. No murmur heard. Pulmonary:     Effort: Pulmonary effort is normal. No respiratory distress.     Breath sounds: Normal breath sounds. No wheezing or rales.  Musculoskeletal:        General: Normal range of motion.     Cervical back: Normal range of motion and neck supple.  Lymphadenopathy:     Cervical: No cervical adenopathy.  Skin:    General: Skin is warm and dry.     Findings: No erythema or rash.  Neurological:     Mental Status: He is alert and oriented to person, place, and time. Mental  status is at baseline.  Psychiatric:        Behavior: Behavior normal.     Comments: Well groomed, good eye contact, normal speech and thoughts     Results for orders placed or performed in visit on 03/01/24  POCT Influenza A/B   Collection Time: 03/01/24  4:35 PM  Result Value Ref Range   Influenza A, POC Negative Negative   Influenza B, POC Positive (A) Negative      Assessment & Plan:   Problem List Items Addressed This Visit   None Visit Diagnoses       Influenza B    -  Primary   Relevant Medications   oseltamivir  (TAMIFLU ) 75 MG capsule   Other Relevant Orders   POCT Influenza A/B (Completed)        Influenza B Exposure to Influenza at work. Acute Influenza B confirmed by positive test here today No significant respiratory distress or wheezing.  He had E Visit already today has rx Tessalon  Perls PRN  - Prescribed Tamiflu , one pill twice daily for five days for treatment for Influenza  - Advised acetaminophen  and ibuprofen for symptom relief. - OTC regimen for Flu symptoms - Provided work note for absence until Monday, contingent on being fever-free and improved. - Sent prescription to pharmacy.        Orders Placed This Encounter  Procedures   POCT Influenza A/B    Meds ordered this encounter  Medications   oseltamivir  (TAMIFLU ) 75 MG capsule    Sig: Take 1 capsule (75 mg total) by mouth 2 (two) times daily. For 5 days    Dispense:  10 capsule    Refill:  0    Follow up plan: Return if symptoms worsen or fail to improve.   Marsa Officer, DO [facility] 03/01/2024, 3:57 PM     [1]  Social History Tobacco Use   Smoking status: Every Day    Current packs/day: 1.50    Average packs/day: 1.5 packs/day for 12.0 years (18.0 ttl pk-yrs)    Types: Cigarettes   Smokeless tobacco: Never  Vaping Use   Vaping status: Never Used  Substance Use Topics   Alcohol use: Yes    Alcohol/week: 2.0  standard drinks of alcohol    Types: 2 Cans of beer per week   Drug use: No   "

## 2024-03-09 ENCOUNTER — Telehealth: Payer: No Typology Code available for payment source | Admitting: Family Medicine

## 2024-03-09 DIAGNOSIS — M791 Myalgia, unspecified site: Secondary | ICD-10-CM

## 2024-03-09 DIAGNOSIS — R6889 Other general symptoms and signs: Secondary | ICD-10-CM

## 2024-03-09 NOTE — Progress Notes (Signed)
" °  Because you are still experiencing symptoms, I feel your condition warrants further evaluation and I recommend that you be seen in a face-to-face visit.   NOTE: There will be NO CHARGE for this E-Visit   If you are having a true medical emergency, please call 911.     For an urgent face to face visit, [place] has multiple urgent care centers for your convenience.  Click the link below for the full list of locations and hours, walk-in wait times, appointment scheduling options and driving directions:  Urgent Care - [place], [place], [place], [place], [place], [place]       Your MyChart E-visit questionnaire answers were reviewed by a board certified advanced clinical practitioner to complete your personal care plan based on your specific symptoms.    Thank you for using e-Visits.  I have spent 5 minutes in review of e-visit questionnaire, review and updating patient chart, medical decision making and response to patient.   Roosvelt Mater, PA-C       "
